# Patient Record
Sex: Female | Born: 1985 | Race: Black or African American | Hispanic: No | Marital: Single | Smoking: Never smoker
Health system: Southern US, Community
[De-identification: ages and names within clinical notes are randomized; demographics above are authoritative.]

## PROBLEM LIST (undated history)

## (undated) DIAGNOSIS — G43909 Migraine, unspecified, not intractable, without status migrainosus: Secondary | ICD-10-CM

## (undated) DIAGNOSIS — E669 Obesity, unspecified: Secondary | ICD-10-CM

## (undated) HISTORY — DX: Obesity, unspecified: E66.9

## (undated) HISTORY — DX: Migraine, unspecified, not intractable, without status migrainosus: G43.909

## (undated) HISTORY — PX: NO PAST SURGERIES: SHX2092

---

## 2007-05-12 ENCOUNTER — Emergency Department (HOSPITAL_COMMUNITY): Admission: EM | Admit: 2007-05-12 | Discharge: 2007-05-13 | Payer: Self-pay | Admitting: Emergency Medicine

## 2012-07-14 ENCOUNTER — Encounter: Payer: BC Managed Care – PPO | Admitting: *Deleted

## 2012-07-14 ENCOUNTER — Encounter: Payer: Self-pay | Admitting: *Deleted

## 2012-07-14 DIAGNOSIS — E669 Obesity, unspecified: Secondary | ICD-10-CM | POA: Insufficient documentation

## 2012-07-14 DIAGNOSIS — Z713 Dietary counseling and surveillance: Secondary | ICD-10-CM | POA: Insufficient documentation

## 2012-07-14 NOTE — Patient Instructions (Addendum)
Plan: Aim for 3 Carb choices (45 grams) per meal +/- 1 either way Aim for 0-1 carbs per snack if hungry Consider reading food labels for total carb of foods Consider increasing activity level as tolerated daily

## 2012-07-14 NOTE — Progress Notes (Signed)
  Medical Nutrition Therapy:  Appt start time: 1500 end time:  1600.  Assessment:  Primary concerns today: patient here for obesity and is at risk for diabetes. 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 She tries to go to gym 3 days a week but would like to increase that frequency. She lives with her Celine Ahr and 3 cousins. Her Aunt shops and prepares the meals.  MEDICATIONS: see list   DIETARY INTAKE:  Usual eating pattern includes 3 meals and 0 snacks per day.  Everyday foods include variety of all food groups.  Avoided foods include fried foods, sodas or milk, .    24-hr recall:  B ( AM): meal replacement shake or fresh fruit (a month ago) with water or juice. Used to do fast food in AM  Snk ( AM): none  L ( PM): brings left overs from home; meat, starch vegetables with water Snk ( PM): none D ( PM): usually at home; baked meat, starch, vegetables, occasionally salad with Ranch dressing, water or 8 oz juice Snk ( PM): none Beverages: juice, water  Usual physical activity: tries to go to gym 2-3 times a week, 30 min on treadmill and 15 minutes on elyptical OR does exercises at home  Estimated energy needs: 1400 calories 158 g carbohydrates 105 g protein 39 g fat  Progress Towards Goal(s):  In progress.   Nutritional Diagnosis:  NI-1.5 Excessive energy intake As related to activity level.  As evidenced by BMI of 38.6    Intervention:  Nutrition counseling initiated for weight loss. Discussed Carb Counting, reading food labels, and benefits of increased activity.  Plan: Aim for 3 Carb choices (45 grams) per meal +/- 1 either way Aim for 0-1 carbs per snack if hungry Consider reading food labels for total carb of foods Consider increasing activity level as tolerated daily  Handouts given during visit include: Carb Counting and Food Label handouts Meal Plan Card  Monitoring/Evaluation:  Dietary intake, exercise, reading food labels, and body weight  prn.

## 2013-06-22 ENCOUNTER — Encounter: Payer: Self-pay | Admitting: Certified Nurse Midwife

## 2014-03-31 ENCOUNTER — Encounter (HOSPITAL_COMMUNITY): Payer: Self-pay | Admitting: Emergency Medicine

## 2014-03-31 ENCOUNTER — Emergency Department (HOSPITAL_COMMUNITY)
Admission: EM | Admit: 2014-03-31 | Discharge: 2014-04-01 | Disposition: A | Discharge status: Home / Self Care | Payer: BC Managed Care – PPO | Attending: Emergency Medicine | Admitting: Emergency Medicine

## 2014-03-31 DIAGNOSIS — R112 Nausea with vomiting, unspecified: Secondary | ICD-10-CM | POA: Insufficient documentation

## 2014-03-31 DIAGNOSIS — E669 Obesity, unspecified: Secondary | ICD-10-CM | POA: Insufficient documentation

## 2014-03-31 NOTE — ED Notes (Signed)
Pt presents with c/o emesis onset 8am this morning with 1 episode of diarrhea.

## 2014-04-01 LAB — I-STAT CHEM 8, ED
BUN: 11 mg/dL (ref 6–23)
CHLORIDE: 106 meq/L (ref 96–112)
Calcium, Ion: 1.22 mmol/L (ref 1.12–1.23)
Creatinine, Ser: 0.8 mg/dL (ref 0.50–1.10)
Glucose, Bld: 81 mg/dL (ref 70–99)
HCT: 40 % (ref 36.0–46.0)
Hemoglobin: 13.6 g/dL (ref 12.0–15.0)
Potassium: 3.5 mEq/L — ABNORMAL LOW (ref 3.7–5.3)
SODIUM: 139 meq/L (ref 137–147)
TCO2: 22 mmol/L (ref 0–100)

## 2014-04-01 MED ORDER — SODIUM CHLORIDE 0.9 % IV SOLN
Freq: Once | INTRAVENOUS | Status: AC
  Administered 2014-04-01: 01:00:00 via INTRAVENOUS

## 2014-04-01 MED ORDER — ONDANSETRON 4 MG PO TBDP: 4.0000 mg | ORAL_TABLET | Freq: Three times a day (TID) | ORAL | Status: DC | PRN

## 2014-04-01 MED ORDER — ONDANSETRON HCL 4 MG/2ML IJ SOLN
4.0000 mg | Freq: Once | INTRAMUSCULAR | Status: AC
  Administered 2014-04-01: 4 mg via INTRAVENOUS
  Filled 2014-04-01: qty 2

## 2014-04-01 NOTE — ED Provider Notes (Signed)
Medical screening examination/treatment/procedure(s) were performed by non-physician practitioner and as supervising physician I was immediately available for consultation/collaboration.   EKG Interpretation None        Nikia Mangino, MD 04/01/14 2315 

## 2014-04-01 NOTE — Discharge Instructions (Signed)

## 2014-04-01 NOTE — ED Provider Notes (Signed)
CSN: [id number]     Arrival date & time 03/31/14  2339 History   First MD Initiated Contact with Patient 04/01/14 0008     Chief Complaint  Patient presents with  . Emesis     (Consider location/radiation/quality/duration/timing/severity/associated sxs/prior Treatment) HPI Comments: Nausea and vomiting throughout the day, unable to tolerate fluids had 1 bowel movement, denies any abdominal pain, UTI symptoms, dysuria  Patient is a 28 y.o. female presenting with vomiting. The history is provided by the patient.  Emesis Severity:  Mild Duration:  1 day Timing:  Constant Quality:  Bilious material Progression:  Unchanged Chronicity:  New Recent urination:  Normal Relieved by:  Nothing Associated symptoms: no abdominal pain, no chills and no diarrhea     Past Medical History  Diagnosis Date  . Obesity    History reviewed. No pertinent past surgical history. No family history on file. History  Substance Use Topics  . Smoking status: Never Smoker   . Smokeless tobacco: Never Used  . Alcohol Use: Yes     Comment: twice a month, mixed drinks or wine   OB History   Grav Para Term Preterm Abortions TAB SAB Ect Mult Living                 Review of Systems  Constitutional: Negative for fever and chills.  Gastrointestinal: Positive for nausea and vomiting. Negative for abdominal pain and diarrhea.  Genitourinary: Negative for dysuria.  All other systems reviewed and are negative.     Allergies  Review of patient's allergies indicates no known allergies.  Home Medications   Prior to Admission medications   Medication Sig Start Date End Date Taking? Authorizing Provider  ibuprofen (ADVIL,MOTRIN) 200 MG tablet Take 400 mg by mouth every 6 (six) hours as needed for headache.   Yes Historical Provider, MD  ondansetron (ZOFRAN ODT) 4 MG disintegrating tablet Take 1 tablet (4 mg total) by mouth every 8 (eight) hours as needed for nausea or vomiting. 04/01/14   Arman Filter,  NP   BP 108/58  Pulse 73  Temp(Src) 98.1 F (36.7 C) (Oral)  Resp 18  Ht 5\' 7"  (1.702 m)  Wt 195 lb (88.451 kg)  BMI 30.53 kg/m2  SpO2 100%  LMP 03/06/2014 Physical Exam  Nursing note and vitals reviewed. Constitutional: She is oriented to person, place, and time. She appears well-developed and well-nourished.  HENT:  Head: Normocephalic.  Eyes: Pupils are equal, round, and reactive to light.  Neck: Normal range of motion.  Cardiovascular: Normal rate and regular rhythm.   Pulmonary/Chest: Effort normal and breath sounds normal.  Musculoskeletal: Normal range of motion.  Neurological: She is alert and oriented to person, place, and time.  Skin: Skin is warm. No rash noted.    ED Course  Procedures (including critical care time) Labs Review Labs Reviewed  I-STAT CHEM 8, ED - Abnormal; Notable for the following:    Potassium 3.5 (*)    All other components within normal limits    Imaging Review No results found.   EKG Interpretation None      MDM  Patient not tolerating fluids.  Will be discharged home with prescription for Zofran Final diagnoses:  Non-intractable vomiting with nausea, vomiting of unspecified type         Arman Filter, NP 04/01/14 [phone]

## 2015-08-13 ENCOUNTER — Emergency Department (HOSPITAL_COMMUNITY)
Admission: EM | Admit: 2015-08-13 | Discharge: 2015-08-13 | Disposition: A | Discharge status: Home / Self Care | Payer: Managed Care, Other (non HMO) | Attending: Emergency Medicine | Admitting: Emergency Medicine

## 2015-08-13 ENCOUNTER — Encounter (HOSPITAL_COMMUNITY): Payer: Self-pay | Admitting: Emergency Medicine

## 2015-08-13 DIAGNOSIS — Z79899 Other long term (current) drug therapy: Secondary | ICD-10-CM | POA: Insufficient documentation

## 2015-08-13 DIAGNOSIS — R1084 Generalized abdominal pain: Secondary | ICD-10-CM | POA: Insufficient documentation

## 2015-08-13 DIAGNOSIS — Z3202 Encounter for pregnancy test, result negative: Secondary | ICD-10-CM | POA: Diagnosis not present

## 2015-08-13 DIAGNOSIS — R112 Nausea with vomiting, unspecified: Secondary | ICD-10-CM

## 2015-08-13 DIAGNOSIS — E669 Obesity, unspecified: Secondary | ICD-10-CM | POA: Diagnosis not present

## 2015-08-13 LAB — CBC WITH DIFFERENTIAL/PLATELET
BASOS PCT: 0 %
Basophils Absolute: 0 10*3/uL (ref 0.0–0.1)
EOS PCT: 0 %
Eosinophils Absolute: 0 10*3/uL (ref 0.0–0.7)
HEMATOCRIT: 36.1 % (ref 36.0–46.0)
HEMOGLOBIN: 12.3 g/dL (ref 12.0–15.0)
LYMPHS PCT: 5 %
Lymphs Abs: 0.3 10*3/uL — ABNORMAL LOW (ref 0.7–4.0)
MCH: 23 pg — AB (ref 26.0–34.0)
MCHC: 34.1 g/dL (ref 30.0–36.0)
MCV: 67.5 fL — AB (ref 78.0–100.0)
Monocytes Absolute: 0.3 10*3/uL (ref 0.1–1.0)
Monocytes Relative: 5 %
NEUTROS ABS: 6.1 10*3/uL (ref 1.7–7.7)
NEUTROS PCT: 90 %
Platelets: 243 10*3/uL (ref 150–400)
RBC: 5.35 MIL/uL — ABNORMAL HIGH (ref 3.87–5.11)
RDW: 14.7 % (ref 11.5–15.5)
WBC: 6.7 10*3/uL (ref 4.0–10.5)

## 2015-08-13 LAB — COMPREHENSIVE METABOLIC PANEL
ALT: 16 U/L (ref 14–54)
ANION GAP: 9 (ref 5–15)
AST: 20 U/L (ref 15–41)
Albumin: 3.9 g/dL (ref 3.5–5.0)
Alkaline Phosphatase: 40 U/L (ref 38–126)
BUN: 10 mg/dL (ref 6–20)
CHLORIDE: 106 mmol/L (ref 101–111)
CO2: 22 mmol/L (ref 22–32)
Calcium: 9.2 mg/dL (ref 8.9–10.3)
Creatinine, Ser: 0.86 mg/dL (ref 0.44–1.00)
Glucose, Bld: 112 mg/dL — ABNORMAL HIGH (ref 65–99)
POTASSIUM: 3.8 mmol/L (ref 3.5–5.1)
Sodium: 137 mmol/L (ref 135–145)
Total Bilirubin: 1.4 mg/dL — ABNORMAL HIGH (ref 0.3–1.2)
Total Protein: 7.6 g/dL (ref 6.5–8.1)

## 2015-08-13 LAB — POC URINE PREG, ED: Preg Test, Ur: NEGATIVE

## 2015-08-13 LAB — I-STAT BETA HCG BLOOD, ED (MC, WL, AP ONLY)

## 2015-08-13 LAB — URINE MICROSCOPIC-ADD ON: RBC / HPF: NONE SEEN RBC/hpf (ref 0–5)

## 2015-08-13 LAB — URINALYSIS, ROUTINE W REFLEX MICROSCOPIC
GLUCOSE, UA: NEGATIVE mg/dL
HGB URINE DIPSTICK: NEGATIVE
Ketones, ur: 15 mg/dL — AB
Nitrite: NEGATIVE
PH: 6 (ref 5.0–8.0)
Protein, ur: NEGATIVE mg/dL
SPECIFIC GRAVITY, URINE: 1.027 (ref 1.005–1.030)

## 2015-08-13 LAB — LIPASE, BLOOD: LIPASE: 32 U/L (ref 11–51)

## 2015-08-13 MED ORDER — ONDANSETRON HCL 4 MG/2ML IJ SOLN
4.0000 mg | Freq: Once | INTRAMUSCULAR | Status: AC
  Administered 2015-08-13: 4 mg via INTRAVENOUS
  Filled 2015-08-13: qty 2

## 2015-08-13 MED ORDER — SODIUM CHLORIDE 0.9 % IV BOLUS (SEPSIS)
1000.0000 mL | Freq: Once | INTRAVENOUS | Status: AC
  Administered 2015-08-13: 1000 mL via INTRAVENOUS

## 2015-08-13 MED ORDER — ONDANSETRON HCL 4 MG PO TABS: 4.0000 mg | ORAL_TABLET | Freq: Four times a day (QID) | ORAL | Status: DC

## 2015-08-13 NOTE — ED Notes (Signed)
Patient d/c'd self care.  F/U and medications discussed.  Patient verbalized understanding. 

## 2015-08-13 NOTE — ED Notes (Signed)
Patient requested to urinate. Patient states she can not urinate at this time.

## 2015-08-13 NOTE — ED Notes (Signed)
Patient ambulatory to restroom  ?

## 2015-08-13 NOTE — Discharge Instructions (Signed)
There does not appear to be an emergent cause for her symptoms at this time. Your exam and labs were all reassuring. Please follow-up with your doctor as needed for reevaluation. Return to ED for immediate reevaluation if you have worsening abdominal pain, fevers, chills or persistent nausea and vomiting as these can all be signs of early appendicitis.  Abdominal Pain, Adult Many things can cause abdominal pain. Usually, abdominal pain is not caused by a disease and will improve without treatment. It can often be observed and treated at home. Your health care provider will do a physical exam and possibly order blood tests and X-rays to help determine the seriousness of your pain. However, in many cases, more time must pass before a clear cause of the pain can be found. Before that point, your health care provider may not know if you need more testing or further treatment. HOME CARE INSTRUCTIONS Monitor your abdominal pain for any changes. The following actions may help to alleviate any discomfort you are experiencing:  Only take over-the-counter or prescription medicines as directed by your health care provider.  Do not take laxatives unless directed to do so by your health care provider.  Try a clear liquid diet (broth, tea, or water) as directed by your health care provider. Slowly move to a bland diet as tolerated. SEEK MEDICAL CARE IF:  You have unexplained abdominal pain.  You have abdominal pain associated with nausea or diarrhea.  You have pain when you urinate or have a bowel movement.  You experience abdominal pain that wakes you in the night.  You have abdominal pain that is worsened or improved by eating food.  You have abdominal pain that is worsened with eating fatty foods.  You have a fever. SEEK IMMEDIATE MEDICAL CARE IF:  Your pain does not go away within 2 hours.  You keep throwing up (vomiting).  Your pain is felt only in portions of the abdomen, such as the right  side or the left lower portion of the abdomen.  You pass bloody or black tarry stools. MAKE SURE YOU:  Understand these instructions.  Will watch your condition.  Will get help right away if you are not doing well or get worse.   This information is not intended to replace advice given to you by your health care provider. Make sure you discuss any questions you have with your health care provider.   Document Released: 06/03/2005 Document Revised: 05/15/2015 Document Reviewed: 05/03/2013 Elsevier Interactive Patient Education [employer]

## 2015-08-13 NOTE — ED Notes (Signed)
Pt c/o abd pain n/v/d onset last evening @ 2100. Emesis once an hour per pt.

## 2015-08-13 NOTE — ED Provider Notes (Signed)
CSN: [id number]     Arrival date & time 08/13/15  0548 History   First MD Initiated Contact with Patient 08/13/15 [phone]     Chief Complaint  Patient presents with  . Emesis     (Consider location/radiation/quality/duration/timing/severity/associated sxs/prior Treatment) HPI Michele Krause is a 29 y.o. female who comes in for evaluation of nausea and vomiting with abdominal discomfort. Patient reports yesterday morning she started having mild, diffuse abdominal pain with one episode of loose stool. She reports when she ate dinner last night at approximately 8 PM, she started having worsening abdominal discomfort and nausea with vomiting "every hour until I got to the emergency department". Eating makes the problem worse. Nothing seems to make it better. No interventions tried. Denies fevers, chills, urinary symptoms, constipation or diarrhea, unusual vaginal bleeding or discharge, back pain. Last menstrual period November 10 and normal for her. No other modifying factors  Past Medical History  Diagnosis Date  . Obesity    History reviewed. No pertinent past surgical history. No family history on file. Social History  Substance Use Topics  . Smoking status: Never Smoker   . Smokeless tobacco: Never Used  . Alcohol Use: Yes     Comment: twice a month, mixed drinks or wine   OB History    No data available     Review of Systems A 10 point review of systems was completed and was negative except for pertinent positives and negatives as mentioned in the history of present illness     Allergies  Review of patient's allergies indicates no known allergies.  Home Medications   Prior to Admission medications   Medication Sig Start Date End Date Taking? Authorizing Provider  ibuprofen (ADVIL,MOTRIN) 200 MG tablet Take 400 mg by mouth every 6 (six) hours as needed for headache.   Yes Historical Provider, MD  Multiple Vitamin (MULTIVITAMIN WITH MINERALS) TABS tablet Take 1 tablet by mouth  daily. Herbal Life Multivitamin   Yes Historical Provider, MD  NON FORMULARY Take 1 capsule by mouth daily. Her[facility] Cell-U-Loss   Yes Historical Provider, MD  NON FORMULARY Take 1 capsule by mouth daily. Herbal Life Total Control   Yes Historical Provider, MD  OVER THE COUNTER MEDICATION Take 1 capsule by mouth daily. Her[facility] Cell Activator   Yes Historical Provider, MD  phentermine (ADIPEX-P) 37.5 MG tablet Take 37.5 mg by mouth every morning. 06/20/15  Yes Historical Provider, MD  ondansetron (ZOFRAN) 4 MG tablet Take 1 tablet (4 mg total) by mouth every 6 (six) hours. 08/13/15   Joycie Peek, PA-C   BP 114/72 mmHg  Pulse 82  Temp(Src) 99.2 F (37.3 C) (Oral)  Resp 16  Ht  (1.727 m)  Wt 90.719 kg  BMI 30.42 kg/m2  SpO2 100% Physical Exam  Constitutional: She is oriented to person, place, and time. She appears well-developed and well-nourished.  HENT:  Head: Normocephalic and atraumatic.  Mouth/Throat: Oropharynx is clear and moist.  Eyes: Conjunctivae are normal. Pupils are equal, round, and reactive to light. Right eye exhibits no discharge. Left eye exhibits no discharge. No scleral icterus.  Neck: Neck supple.  Cardiovascular: Normal rate, regular rhythm and normal heart sounds.   Pulmonary/Chest: Effort normal and breath sounds normal. No respiratory distress. She has no wheezes. She has no rales.  Abdominal: Soft. There is no tenderness.  Diffuse abdominal discomfort with palpation. Abdomen otherwise soft, nondistended. No rebound or guarding.  Musculoskeletal: She exhibits no tenderness.  Neurological: She is alert  and oriented to person, place, and time.  Cranial Nerves II-XII grossly intact  Skin: Skin is warm and dry. No rash noted.  Psychiatric: She has a normal mood and affect.  Nursing note and vitals reviewed.   ED Course  Procedures (including critical care time) Labs Review Labs Reviewed  COMPREHENSIVE METABOLIC PANEL - Abnormal; Notable for the  following:    Glucose, Bld 112 (*)    Total Bilirubin 1.4 (*)    All other components within normal limits  CBC WITH DIFFERENTIAL/PLATELET - Abnormal; Notable for the following:    RBC 5.35 (*)    MCV 67.5 (*)    MCH 23.0 (*)    Lymphs Abs 0.3 (*)    All other components within normal limits  URINALYSIS, ROUTINE W REFLEX MICROSCOPIC (NOT AT [facility]) - Abnormal; Notable for the following:    Color, Urine AMBER (*)    APPearance CLOUDY (*)    Bilirubin Urine SMALL (*)    Ketones, ur 15 (*)    Leukocytes, UA TRACE (*)    All other components within normal limits  URINE MICROSCOPIC-ADD ON - Abnormal; Notable for the following:    Squamous Epithelial / LPF 0-5 (*)    Bacteria, UA RARE (*)    All other components within normal limits  LIPASE, BLOOD  I-STAT BETA HCG BLOOD, ED ([facility], [facility], [facility] ONLY)  POC URINE PREG, ED    Imaging Review No results found. I have personally reviewed and evaluated these images and lab results as part of my medical decision-making.   EKG Interpretation None     Meds given in ED:  Medications  ondansetron (ZOFRAN) injection 4 mg (4 mg Intravenous Given 08/13/15 0656)  sodium chloride 0.9 % bolus 1,000 mL (0 mLs Intravenous Stopped 08/13/15 0813)    Discharge Medication List as of 08/13/2015  8:44 AM    START taking these medications   Details  ondansetron (ZOFRAN) 4 MG tablet Take 1 tablet (4 mg total) by mouth every 6 (six) hours., Starting 08/13/2015, Until Discontinued, Print       Filed Vitals:   08/13/15 0557 08/13/15 0730 08/13/15 0853  BP: 113/78 128/76 114/72  Pulse: 117 84 82  Temp: 98 F (36.7 C) 98.1 F (36.7 C) 99.2 F (37.3 C)  TempSrc: Oral Oral Oral  Resp: Height:  (1.727 m)    Weight: 90.719 kg    SpO2: 100% 100% 100%    MDM  Michele Krause is a 29 y.o. female who presents for evaluation of abdominal discomfort with nausea and vomiting and loose stool. On arrival, she appears well, afebrile and there is a  recorded tachycardia, however there is no tachycardia on my exam and discharge heart rate is 84. Possible mild dehydration versus activity trying to get back to room. No nausea or vomiting in the ED, no loose stool or diarrhea. She is tolerating oral fluids.  Feels much better after administration of Zofran and IV fluids. No focal abdominal tenderness on exam. Labs are unremarkable. No evidence of overt UTI. Discussed discharge with some symptomatic support at home, including anti-emetics, aggressive oral rehydration at home. Follow-up with PCP as needed. Overall, patient appears well, nontoxic, hemodynamically stable and afebrile and is appropriate for outpatient follow-up. Final diagnoses:  Non-intractable vomiting with nausea, vomiting of unspecified type  Abdominal discomfort, generalized       Joycie Peek, PA-C 08/14/15 1610  Shon Baton, MD 08/14/15 [phone]

## 2020-02-09 ENCOUNTER — Other Ambulatory Visit: Payer: Self-pay | Admitting: Nurse Practitioner

## 2020-02-09 DIAGNOSIS — G4452 New daily persistent headache (NDPH): Secondary | ICD-10-CM

## 2020-03-09 ENCOUNTER — Ambulatory Visit
Admission: RE | Admit: 2020-03-09 | Discharge: 2020-03-09 | Disposition: A | Discharge status: Home / Self Care | Payer: Managed Care, Other (non HMO) | Source: Ambulatory Visit | Attending: Nurse Practitioner | Admitting: Nurse Practitioner

## 2020-03-09 DIAGNOSIS — G4452 New daily persistent headache (NDPH): Secondary | ICD-10-CM

## 2020-03-27 ENCOUNTER — Ambulatory Visit (INDEPENDENT_AMBULATORY_CARE_PROVIDER_SITE_OTHER): Payer: 59 | Admitting: Neurology

## 2020-03-27 ENCOUNTER — Encounter: Payer: Self-pay | Admitting: Neurology

## 2020-03-27 VITALS — BP 114/73 | HR 91 | Ht 67.0 in | Wt 185.0 lb

## 2020-03-27 DIAGNOSIS — R200 Anesthesia of skin: Secondary | ICD-10-CM

## 2020-03-27 DIAGNOSIS — IMO0002 Reserved for concepts with insufficient information to code with codable children: Secondary | ICD-10-CM

## 2020-03-27 DIAGNOSIS — H532 Diplopia: Secondary | ICD-10-CM

## 2020-03-27 DIAGNOSIS — R9089 Other abnormal findings on diagnostic imaging of central nervous system: Secondary | ICD-10-CM | POA: Diagnosis not present

## 2020-03-27 DIAGNOSIS — G43709 Chronic migraine without aura, not intractable, without status migrainosus: Secondary | ICD-10-CM

## 2020-03-27 MED ORDER — AJOVY 225 MG/1.5ML ~~LOC~~ SOSY
1.0000 | PREFILLED_SYRINGE | SUBCUTANEOUS | 3 refills | Status: DC
Start: 2020-03-27 — End: 2023-10-05

## 2020-03-27 NOTE — Progress Notes (Signed)
[facility]  PATIENT: Michele Krause DOB: 12-Sep-1985  REFERRING DOCTOR OR PCP: Mitzi Hansen, MD SOURCE: Patient, notes from primary care, imaging laboratory reports, MRI images personally reviewed.  _________________________________   HISTORICAL  CHIEF COMPLAINT:  Chief Complaint  Patient presents with  . New Patient (Initial Visit)    RM 13. Paper referral from Mitzi Hansen, NP for abnormal MRI/MS?     HISTORY OF PRESENT ILLNESS:  I had the pleasure of seeing patient, Michele Krause, at the MS center at [facility] for neurologic consultation regarding her headaches and abnormal brain MRI potentially worrisome for multiple sclerosis.  She is a 34 year old woman who has had difficulty with frequent migraine headaches that became daily around March 2021.   After a trip to [place] in April she had a pounding pain with reduced vision OD for 30 minutes.   She would get intermittent visual changes like this associated with headaches, always on the right.  Vision seemed ok in between headaches.     She has noted diplopia, worse with looking at a computer screen for many years.  However, this is much worse this year, even when she does not have a headache.    She notes it more when focusing on something.      She had the onset of left sided numbness 3 weeks ago from shoulder to leg that she notes if she sits a while or stands a while.   She also notes her balance has been poor since she was younger.   For the past 2 years, though, she notes a feeling like she will topple over if she stands on one spot.     She started to see the Headache center and was placed on zonisamide and trigger point injections.   However, this has not helped much and they occur daily.   She gets nausea, photophobia and phonophobia.   She takes sumatriptan or baclofen when a headache occurs.   This only helps the stabbing pain but does not make the headache go away.    For  chronic migraines she has tried and failed anti-epileptic (zonisamide), muscle relaxant (baclofen) and triptans (sumatriptrans)  I personally reviewed the MRI of the brain dated 03/09/2020.  It shows multiple T2/FLAIR hyperintense foci.  2 are located in the right middle cerebellar peduncle and adjacent cerebellum others are located in the hemispheres, some in the periventricular white matter and some in the deep white matter.  Sagittal images were not performed.  Contrast was not performed but two foci in the periventricular white matter were mildly bright on diffusion-weighted images.  She has a second cousin with MS diagnosed in 2002 diagnosed in early 76's.      REVIEW OF SYSTEMS: Constitutional: No fevers, chills, sweats, or change in appetite Eyes: No visual changes, double vision, eye pain Ear, nose and throat: No hearing loss, ear pain, nasal congestion, sore throat Cardiovascular: No chest pain, palpitations Respiratory: No shortness of breath at rest or with exertion.   No wheezes GastrointestinaI: No nausea, vomiting, diarrhea, abdominal pain, fecal incontinence Genitourinary: No dysuria, urinary retention or frequency.  No nocturia. Musculoskeletal: No neck pain, back pain Integumentary: No rash, pruritus, skin lesions Neurological: as above Psychiatric: No depression at this time.  No anxiety Endocrine: No palpitations, diaphoresis, change in appetite, change in weigh or increased thirst Hematologic/Lymphatic: No anemia, purpura, petechiae. Allergic/Immunologic: No itchy/runny eyes, nasal congestion, recent allergic reactions, rashes  ALLERGIES: No Known Allergies  HOME MEDICATIONS:  Current Outpatient Medications:  .  baclofen (LIORESAL) 10 MG tablet, Take 1 tablet by mouth every 30 (thirty) days., Disp: , Rfl:  .  ibuprofen (ADVIL,MOTRIN) 200 MG tablet, Take 400 mg by mouth every 6 (six) hours as needed for headache., Disp: , Rfl:  .  Multiple Vitamin (MULTIVITAMIN  WITH MINERALS) TABS tablet, Take 1 tablet by mouth daily. Herbal Life Multivitamin, Disp: , Rfl:  .  NON FORMULARY, Take 1 capsule by mouth daily. Herbal Life Cell-U-Loss, Disp: , Rfl:  .  NON FORMULARY, Take 1 capsule by mouth daily. Herbal Life Total Control, Disp: , Rfl:  .  Norethindrone-Ethinyl Estradiol-Fe (KAITLIB FE) 0.8-25 MG-MCG tablet, Chew 1 tablet by mouth daily., Disp: , Rfl:  .  ondansetron (ZOFRAN) 4 MG tablet, Take 1 tablet (4 mg total) by mouth every 6 (six) hours., Disp: 12 tablet, Rfl: 0 .  OVER THE COUNTER MEDICATION, Take 1 capsule by mouth daily. Herbal Life Cell Activator, Disp: , Rfl:  .  SUMAtriptan (IMITREX) 100 MG tablet, Take 100 mg by mouth as needed., Disp: , Rfl:  .  zonisamide (ZONEGRAN) 100 MG capsule, Take 100 mg by mouth daily. Will be going to 150mg  po qd, Disp: , Rfl:  .  phentermine (ADIPEX-P) 37.5 MG tablet, Take 37.5 mg by mouth every morning. (Patient not taking: Reported on 03/27/2020), Disp: , Rfl: 1  PAST MEDICAL HISTORY: Past Medical History:  Diagnosis Date  . Migraine   . Obesity     PAST SURGICAL HISTORY: Past Surgical History:  Procedure Laterality Date  . NO PAST SURGERIES      FAMILY HISTORY: History reviewed. No pertinent family history.  SOCIAL HISTORY:  Social History   Socioeconomic History  . Marital status: Single    Spouse name: Not on file  . Number of children: 0  . Years of education: BA  . Highest education level: Not on file  Occupational History  . Occupation: on [employer]  Tobacco Use  . Smoking status: Never Smoker  . Smokeless tobacco: Never Used  Substance and Sexual Activity  . Alcohol use: Yes    Comment: socially- 2 glasses per week or less  . Drug use: No  . Sexual activity: Not on file  Other Topics Concern  . Not on file  Social History Narrative   Right handed    Caffeine use: none   Social Determinants of Health   Financial Resource Strain:   . Difficulty of Paying Living Expenses:   Food  Insecurity:   . Worried About Programme researcher, broadcasting/film/video in the Last Year:   . Barista in the Last Year:   Transportation Needs:   . Freight forwarder (Medical):   [name] Kitchen Lack of Transportation (Non-Medical):   Physical Activity:   . Days of Exercise per Week:   . Minutes of Exercise per Session:   Stress:   . Feeling of Stress :   Social Connections:   . Frequency of Communication with Friends and Family:   . Frequency of Social Gatherings with Friends and Family:   . Attends Religious Services:   . Active Member of Clubs or Organizations:   . Attends Banker Meetings:   [name] Kitchen Marital Status:   Intimate Partner Violence:   . Fear of Current or Ex-Partner:   . Emotionally Abused:   [name] Kitchen Physically Abused:   . Sexually Abused:      PHYSICAL EXAM  There were no vitals filed for this visit.  There is no height or weight on file to calculate BMI.   Hearing Screening   125Hz  250Hz  500Hz  1000Hz  2000Hz  3000Hz  4000Hz  6000Hz  8000Hz   Right ear:           Left ear:             Visual Acuity Screening   Right eye Left eye Both eyes  Without correction: 20/30 20/30 20/30   With correction:       General: The patient is well-developed and well-nourished and in no acute distress  HEENT:  Head is [place]/AT.  Sclera are anicteric.  Funduscopic exam shows normal optic discs and retinal vessels.  Neck: No carotid bruits are noted.  The neck is nontender.  Cardiovascular: The heart has a regular rate and rhythm with a normal S1 and S2. There were no murmurs, gallops or rubs.    Skin: Extremities are without rash or  edema.  Musculoskeletal:  Back is nontender  Neurologic Exam  Mental status: The patient is alert and oriented x 3 at the time of the examination. The patient has apparent normal recent and remote memory, with an apparently normal attention span and concentration ability.   Speech is normal.  Cranial nerves: Extraocular movements are full. Pupils are equal, round,  and reactive to light and accomodation.  Visual fields are full.  Facial symmetry is present. There is good facial sensation to soft touch bilaterally.Facial strength is normal.  Trapezius and sternocleidomastoid strength is normal. No dysarthria is noted.  The tongue is midline, and the patient has symmetric elevation of the soft palate. No obvious hearing deficits are noted.  Motor:  Muscle bulk is normal.   Tone is normal. Strength is  5 / 5 in all 4 extremities.   Sensory: Sensory testing is intact to pinprick, soft touch and vibration sensation in all 4 extremities.  Coordination: Cerebellar testing reveals good finger-nose-finger and heel-to-shin bilaterally.  Gait and station: Station is normal.   Gait is normal. Tandem gait is mildly wide. Romberg is negative.   Reflexes: Deep tendon reflexes are symmetric and normal bilaterally.   Plantar responses are flexor.     ASSESSMENT AND PLAN  Abnormal brain MRI - Plan: MR BRAIN W CONTRAST, MR CERVICAL SPINE W WO CONTRAST  Diplopia  Numbness - Plan: MR BRAIN W CONTRAST, MR CERVICAL SPINE W WO CONTRAST  Chronic migraine   In summary, Michele Krause is a 34 year old woman with chronic migraine who was found on MRI to have multiple T2/FLAIR hyperintense foci in a pattern potentially consistent with multiple sclerosis.  Additionally, she has had some mild intermittent neurologic symptoms such as numbness and diplopia but currently has no definite objective findings on examination.  I will check an MRI of the cervical spine due to the numbness and coordination issues that she has reported to determine if she has any demyelinating plaque in the spinal cord.  Additionally will order an MRI of the brain with contrast (prior 1 was without).  If she has any lesions in the cervical spine, additional lesions in the brain or enhancing lesions of the brain, 10 she would meet the criteria for multiple sclerosis and I would recommend that we initiate a disease  modifying therapy.  If the additional scans did not show any abnormalities, then we would need to have additional data to diagnose MS.  I discussed with her that the 2 options would be to check a lumbar puncture or to check an MRI of the brain in 6  to 9 months.  We will make a decision after the MRI studies are complete.  A second problem is the chronic migraine.  She feels that there has not been any benefit from the series of trigger point injections and not much benefit from zonisamide.  I gave her a sample of Ajovy and sent in a prescription.  Hopefully this will help the headaches more.  She will return to see me for regular visit in several months but we will bring her in sooner if the studies are consistent with multiple sclerosis to initiate a disease modifying therapy.  Thank you for asking me to see Michele Krause.  Please let me know if I can be of further assistance with her or other patients in the future.    For chronic migraines she has tried and failed anti-epileptic (zonisamide), muscle relaxant (baclofen) and triptans (sumatriptrans)   Avian Konigsberg A. Epimenio Foot, MD, [facility] 03/27/2020, 1:39 PM Certified in Neurology, Clinical Neurophysiology, Sleep Medicine and Neuroimaging  [facility] [address] [place], [place] [address] [phone]

## 2020-03-28 ENCOUNTER — Telehealth: Payer: Self-pay | Admitting: *Deleted

## 2020-03-28 NOTE — Telephone Encounter (Signed)
Submitted PA Ajovy on CMM. Key: [id number]. Waiting on determination from [facility].

## 2020-04-01 NOTE — Telephone Encounter (Signed)
Received fax from [facility] that PA approved 03/30/20-06/30/20. PA# [employer] [id number]

## 2020-04-21 ENCOUNTER — Ambulatory Visit
Admission: RE | Admit: 2020-04-21 | Discharge: 2020-04-21 | Disposition: A | Discharge status: Home / Self Care | Payer: 59 | Source: Ambulatory Visit | Attending: Neurology | Admitting: Neurology

## 2020-04-21 ENCOUNTER — Other Ambulatory Visit: Payer: Self-pay

## 2020-04-21 DIAGNOSIS — R9089 Other abnormal findings on diagnostic imaging of central nervous system: Secondary | ICD-10-CM

## 2020-04-21 DIAGNOSIS — R200 Anesthesia of skin: Secondary | ICD-10-CM

## 2020-04-21 MED ORDER — GADOBENATE DIMEGLUMINE 529 MG/ML IV SOLN
15.0000 mL | Freq: Once | INTRAVENOUS | Status: AC | PRN
  Administered 2020-04-21: 15 mL via INTRAVENOUS

## 2020-04-29 ENCOUNTER — Ambulatory Visit: Payer: Self-pay | Admitting: Neurology

## 2020-05-01 ENCOUNTER — Encounter: Payer: Self-pay | Admitting: Neurology

## 2020-05-01 ENCOUNTER — Ambulatory Visit (INDEPENDENT_AMBULATORY_CARE_PROVIDER_SITE_OTHER): Payer: 59 | Admitting: Neurology

## 2020-05-01 ENCOUNTER — Telehealth: Payer: Self-pay | Admitting: *Deleted

## 2020-05-01 VITALS — BP 117/85 | HR 75 | Ht 67.0 in | Wt 186.0 lb

## 2020-05-01 DIAGNOSIS — H539 Unspecified visual disturbance: Secondary | ICD-10-CM

## 2020-05-01 DIAGNOSIS — Z5181 Encounter for therapeutic drug level monitoring: Secondary | ICD-10-CM | POA: Diagnosis not present

## 2020-05-01 DIAGNOSIS — E559 Vitamin D deficiency, unspecified: Secondary | ICD-10-CM

## 2020-05-01 DIAGNOSIS — H532 Diplopia: Secondary | ICD-10-CM | POA: Insufficient documentation

## 2020-05-01 DIAGNOSIS — G35 Multiple sclerosis: Secondary | ICD-10-CM | POA: Diagnosis not present

## 2020-05-01 DIAGNOSIS — R200 Anesthesia of skin: Secondary | ICD-10-CM

## 2020-05-01 DIAGNOSIS — Z79899 Other long term (current) drug therapy: Secondary | ICD-10-CM | POA: Insufficient documentation

## 2020-05-01 NOTE — Telephone Encounter (Signed)
Placed [facility] lab in [facility] lock box for routine lab pick up. Results pending. 

## 2020-05-01 NOTE — Progress Notes (Signed)
[facility]  PATIENT: Michele Krause DOB: 03/12/1986  REFERRING DOCTOR OR PCP: Mitzi Hansen, MD SOURCE: Patient, notes from primary care, imaging laboratory reports, MRI images personally reviewed.  _________________________________   HISTORICAL  CHIEF COMPLAINT:  Chief Complaint  Patient presents with  . Follow-up    RM 13, alone.  Last seen 03/27/20. She has noticed swelling in feet/numb in calves that started 2-3 weeks ago. Happening more frequently and lasting longer.   . Migraine    Has not started on Ajovy yet. Stopped steroid inj at headache center. States migraines have been okay. She wanted to figure out MS first prior to starting on this medication.    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  On further questioning, she has had some neurologic symptoms.    She had the onset of left sided numbness in early June 2021 from shoulder to leg that she notes if she sits a while or stands a while.  In April 2021, she noted headache and reduced left sided vision.     Since 2019,  she notes a feeling like she will topple over if she stands on one spot.     Since the last visit, she had MRI of the brain and cervical spine.  The MRI of the cervical spine MRI 04/21/2020.   It showed 3 small T2 hyperintense foci within the spinal cord, adjacent to C3, C4-C5 and C6-C7 as detailed above.  None of these enhance or appear to be acute.  These are consistent with chronic demyelinating plaque associated with multiple sclerosis.   MRI brain 04/21/2020 showed multiple T2/FLAIR hyperintensity involving right cerebellum, subtle brainstem, periventricular and pericallosal regions with suggestive of demyelinating disease.  No enhancing lesions are noted.  It was unchanged compared to the 03/09/2020 MRI  She is on birth control currently.    She is now planning for  a pregnancy in the next couple years but will likely want to have one more child.  In the future.    She has Ajovy shots now but since migraines got better she has not started.   HA improved going up to 150 mg zonisamide.       She just got the Covid vaccination x 2 June/July 2021.    I discussed with her that given her history of neurologic symptoms consistent with mild relapses and the abnormal MRI of the brain and cervical spine, she meets the McDonald criteria for relapsing remitting MS.  We had a long conversation about various treatment options for MS    MS History Donald Pacini had difficulty with frequent migraine headaches that became daily around March 2021.  She had an MRI 03/09/2020 showing changes c/w MS (Radiologically isolated syndrome) and was referred to me.   On further review, she has had some neurologic symptoms.    She had the onset of left sided numbness in early June 2021 from shoulder to leg that she notes if she sits a while or stands a while.  In April 2021, she noted headache and reduced left sided vision.   She also notes her balance has been poor since she was younger.   Since 2019, though, she notes a feeling like she will topple over if she stands on one spot.     I personally reviewed the MRI of the brain dated 03/09/2020.  It shows multiple T2/FLAIR  hyperintense foci.  2 are located in the right middle cerebellar peduncle and adjacent cerebellum others are located in the hemispheres, some in the periventricular white matter and some in the deep white matter.  Sagittal images were not performed.  Contrast was not performed but two foci in the periventricular white matter were mildly bright on diffusion-weighted images.  She has a second cousin with MS diagnosed in 2002 diagnosed in early 7's.     The MRI of the cervical spine MRI 04/21/2020.   It showed 3 small T2 hyperintense foci within the spinal cord, adjacent to C3, C4-C5 and C6-C7 as detailed above.  None of these  enhance or appear to be acute.  These are consistent with chronic demyelinating plaque associated with multiple sclerosis.     MRI brain 04/21/2020 showed multiple T2/FLAIR hyperintensity involving right cerebellum, subtle brainstem, periventricular and pericallosal regions with suggestive of demyelinating disease.  No enhancing lesions are noted.  It was unchanged compared to the 03/09/2020 MRI   Headache history: She is currently on zonisamide and has had trigger point injections for her headaches.  The higher dose of zonisamide has helped the headache more.  She gets nausea, photophobia and phonophobia.   She takes sumatriptan or baclofen when a headache occurs.   This only helps the stabbing pain but does not make the headache go away.    For chronic migraines she has tried and failed anti-epileptic (zonisamide), muscle relaxant (baclofen) and triptans (sumatriptrans).  I gave her a prescription for Ajovy that she has filled.  She has not yet started.   REVIEW OF SYSTEMS: Constitutional: No fevers, chills, sweats, or change in appetite Eyes: No visual changes, double vision, eye pain Ear, nose and throat: No hearing loss, ear pain, nasal congestion, sore throat Cardiovascular: No chest pain, palpitations Respiratory: No shortness of breath at rest or with exertion.   No wheezes GastrointestinaI: No nausea, vomiting, diarrhea, abdominal pain, fecal incontinence Genitourinary: No dysuria, urinary retention or frequency.  No nocturia. Musculoskeletal: No neck pain, back pain Integumentary: No rash, pruritus, skin lesions Neurological: as above Psychiatric: No depression at this time.  No anxiety Endocrine: No palpitations, diaphoresis, change in appetite, change in weigh or increased thirst Hematologic/Lymphatic: No anemia, purpura, petechiae. Allergic/Immunologic: No itchy/runny eyes, nasal congestion, recent allergic reactions, rashes  ALLERGIES: No Known Allergies  HOME  MEDICATIONS:  Current Outpatient Medications:  .  baclofen (LIORESAL) 10 MG tablet, Take 1 tablet by mouth every 30 (thirty) days., Disp: , Rfl:  .  ibuprofen (ADVIL,MOTRIN) 200 MG tablet, Take 400 mg by mouth every 6 (six) hours as needed for headache., Disp: , Rfl:  .  KAITLIB FE 0.8-25 MG-MCG tablet, 1 tablet daily., Disp: , Rfl:  .  LINZESS 145 MCG CAPS capsule, Take 145 mcg by mouth daily., Disp: , Rfl:  .  Multiple Vitamin (MULTIVITAMIN WITH MINERALS) TABS tablet, Take 1 tablet by mouth daily. Herbal Life Multivitamin, Disp: , Rfl:  .  NON FORMULARY, Take 1 capsule by mouth daily. Herbal Life Cell-U-Loss, Disp: , Rfl:  .  NON FORMULARY, Take 1 capsule by mouth daily. Herbal Life Total Control, Disp: , Rfl:  .  Norethindrone-Ethinyl Estradiol-Fe (KAITLIB FE) 0.8-25 MG-MCG tablet, Chew 1 tablet by mouth daily., Disp: , Rfl:  .  ondansetron (ZOFRAN) 4 MG tablet, Take 1 tablet (4 mg total) by mouth every 6 (six) hours., Disp: 12 tablet, Rfl: 0 .  OVER THE COUNTER MEDICATION, Take 1 capsule by mouth daily. Herbal Life Cell  Activator, Disp: , Rfl:  .  phentermine (ADIPEX-P) 37.5 MG tablet, Take 37.5 mg by mouth every morning. , Disp: , Rfl: 1 .  SUMAtriptan (IMITREX) 100 MG tablet, Take 100 mg by mouth as needed., Disp: , Rfl:  .  zonisamide (ZONEGRAN) 100 MG capsule, Take 100 mg by mouth daily. Will be going to 150mg  po qd, Disp: , Rfl:  .  Fremanezumab-vfrm (AJOVY) 225 MG/1.5ML SOSY, Inject 1 Syringe into the skin every 28 (twenty-eight) days. (Patient not taking: Reported on 05/01/2020), Disp: 4.5 mL, Rfl: 3  PAST MEDICAL HISTORY: Past Medical History:  Diagnosis Date  . Migraine   . Obesity     PAST SURGICAL HISTORY: Past Surgical History:  Procedure Laterality Date  . NO PAST SURGERIES      FAMILY HISTORY: No family history on file.  SOCIAL HISTORY:  Social History   Socioeconomic History  . Marital status: Single    Spouse name: Not on file  . Number of children: 0  .  Years of education: BA  . Highest education level: Not on file  Occupational History  . Occupation: on [employer]  Tobacco Use  . Smoking status: Never Smoker  . Smokeless tobacco: Never Used  Substance and Sexual Activity  . Alcohol use: Yes    Comment: socially- 2 glasses per week or less  . Drug use: No  . Sexual activity: Not on file  Other Topics Concern  . Not on file  Social History Narrative   Right handed    Caffeine use: none   Social Determinants of Health   Financial Resource Strain:   . Difficulty of Paying Living Expenses: Not on file  Food Insecurity:   . Worried About 05/03/2020 in the Last Year: Not on file  . Ran Out of Food in the Last Year: Not on file  Transportation Needs:   . Lack of Transportation (Medical): Not on file  . Lack of Transportation (Non-Medical): Not on file  Physical Activity:   . Days of Exercise per Week: Not on file  . Minutes of Exercise per Session: Not on file  Stress:   . Feeling of Stress : Not on file  Social Connections:   . Frequency of Communication with Friends and Family: Not on file  . Frequency of Social Gatherings with Friends and Family: Not on file  . Attends Religious Services: Not on file  . Active Member of Clubs or Organizations: Not on file  . Attends Programme researcher, broadcasting/film/video Meetings: Not on file  . Marital Status: Not on file  Intimate Partner Violence:   . Fear of Current or Ex-Partner: Not on file  . Emotionally Abused: Not on file  . Physically Abused: Not on file  . Sexually Abused: Not on file     PHYSICAL EXAM  Vitals:   05/01/20 0854  BP: 117/85  Pulse: 75  Weight: 186 lb (84.4 kg)  Height: 5\' 7"  (1.702 m)    Body mass index is 29.13 kg/m.    General: The patient is well-developed and well-nourished and in no acute distress  Neurologic Exam  Mental status: The patient is alert and oriented x 3 at the time of the examination. The patient has apparent normal recent and remote memory,  with an apparently normal attention span and concentration ability.   Speech is normal.  Cranial nerves: Extraocular movements are full. Pupils are equal, round, and reactive to light and accomodation.   Reduced color vision OS.  Facial symmetry is present. There is good facial sensation to soft touch bilaterally.Facial strength is normal.  Trapezius and sternocleidomastoid strength is normal. No dysarthria is noted.  The tongue is midline, and the patient has symmetric elevation of the soft palate. No obvious hearing deficits are noted.  Motor:  Muscle bulk is normal.   Tone is normal. Strength is  5 / 5 in all 4 extremities.   Sensory: Sensory testing is intact to pinprick, soft touch and vibration sensation in all 4 extremities.  Coordination: Cerebellar testing reveals good finger-nose-finger and heel-to-shin bilaterally.  Gait and station: Station is normal.   Gait is normal. Tandem gait is mildly wide. Romberg is negative.   Reflexes: Deep tendon reflexes are symmetric and normal bilaterally.   Plantar responses are flexor.     ASSESSMENT AND PLAN  Relapsing remitting multiple sclerosis (HCC)  High risk medication use  Encounter for monitoring immunomodulating therapy  Vision disturbance  Left sided numbness  1.   Ms. 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  Additionally the MRI of the cervical spine shows multiple plaques consistent with MS. 2.   We had a long conversation about treatment options.  Due to having several plaques in the spinal cord, I consider her to be at increased risk of progressing at a higher rate.  We went over a few treatment options including Ocrevus,  Kesimpta, Tysabri and Zeposia.  We did discuss that if she thinks she might want to have children in the next couple of years that I would recommend one of the IV options.  She is going to give this more thought and we will check blood work for the various options. 3.   I will talk to her after we get all the blood work back or she should call sooner she has new or worsening neurologic symptoms.    Margurite Duffy A. Epimenio Foot, MD, [facility] 05/01/2020, 10:05 AM Certified in Neurology, Clinical Neurophysiology, Sleep Medicine and Neuroimaging  [facility] [address] [place], [place] [address] [phone]

## 2020-05-06 ENCOUNTER — Telehealth: Payer: Self-pay | Admitting: Neurology

## 2020-05-06 NOTE — Telephone Encounter (Signed)
Dr. Sater- what would you recommend? 

## 2020-05-06 NOTE — Telephone Encounter (Signed)
Pt called had blood drawn on 8/24. From that day to present have had numbness, feeling like a string is pulling from arm to fingers. Would like call from the nurse.

## 2020-05-06 NOTE — Telephone Encounter (Signed)
JCV ab drawn on 05/01/20 positive, index: 1.80

## 2020-05-07 LAB — CBC WITH DIFFERENTIAL/PLATELET
Basophils Absolute: 0 10*3/uL (ref 0.0–0.2)
Basos: 0 %
EOS (ABSOLUTE): 0 10*3/uL (ref 0.0–0.4)
Eos: 1 %
Hematocrit: 38.8 % (ref 34.0–46.6)
Hemoglobin: 11.9 g/dL (ref 11.1–15.9)
Immature Grans (Abs): 0 10*3/uL (ref 0.0–0.1)
Immature Granulocytes: 0 %
Lymphocytes Absolute: 2.2 10*3/uL (ref 0.7–3.1)
Lymphs: 43 %
MCH: 23.1 pg — ABNORMAL LOW (ref 26.6–33.0)
MCHC: 30.7 g/dL — ABNORMAL LOW (ref 31.5–35.7)
MCV: 75 fL — ABNORMAL LOW (ref 79–97)
Monocytes Absolute: 0.5 10*3/uL (ref 0.1–0.9)
Monocytes: 9 %
Neutrophils Absolute: 2.5 10*3/uL (ref 1.4–7.0)
Neutrophils: 47 %
Platelets: 321 10*3/uL (ref 150–450)
RBC: 5.16 x10E6/uL (ref 3.77–5.28)
RDW: 17.4 % — ABNORMAL HIGH (ref 11.7–15.4)
WBC: 5.2 10*3/uL (ref 3.4–10.8)

## 2020-05-07 LAB — QUANTIFERON-TB GOLD PLUS
QuantiFERON Mitogen Value: >10 IU/mL
QuantiFERON Nil Value: 0.02 IU/mL
QuantiFERON TB1 Ag Value: 0.06 IU/mL
QuantiFERON TB2 Ag Value: 0.05 IU/mL
QuantiFERON-TB Gold Plus: NEGATIVE

## 2020-05-07 LAB — HEPATITIS B CORE ANTIBODY, TOTAL: Hep B Core Total Ab: NEGATIVE

## 2020-05-07 LAB — COMPREHENSIVE METABOLIC PANEL
ALT: 12 IU/L (ref 0–32)
AST: 14 IU/L (ref 0–40)
Albumin/Globulin Ratio: 1.5 (ref 1.2–2.2)
Albumin: 4.5 g/dL (ref 3.8–4.8)
Alkaline Phosphatase: 40 IU/L — ABNORMAL LOW (ref 48–121)
BUN/Creatinine Ratio: 11 (ref 9–23)
BUN: 11 mg/dL (ref 6–20)
Bilirubin Total: 0.6 mg/dL (ref 0.0–1.2)
CO2: 21 mmol/L (ref 20–29)
Calcium: 10.2 mg/dL (ref 8.7–10.2)
Chloride: 108 mmol/L — ABNORMAL HIGH (ref 96–106)
Creatinine, Ser: 1 mg/dL (ref 0.57–1.00)
GFR calc Af Amer: 86 mL/min/{1.73_m2} (ref >59)
GFR calc non Af Amer: 75 mL/min/{1.73_m2} (ref >59)
Globulin, Total: 3 g/dL (ref 1.5–4.5)
Glucose: 92 mg/dL (ref 65–99)
Potassium: 5.1 mmol/L (ref 3.5–5.2)
Sodium: 141 mmol/L (ref 134–144)
Total Protein: 7.5 g/dL (ref 6.0–8.5)

## 2020-05-07 LAB — CYP2C9 GENOTYPING SIPONIMOD

## 2020-05-07 LAB — VARICELLA ZOSTER ANTIBODY, IGG: Varicella zoster IgG: 3939 index (ref >165)

## 2020-05-07 LAB — HIV ANTIBODY (ROUTINE TESTING W REFLEX): HIV Screen 4th Generation wRfx: NONREACTIVE

## 2020-05-07 LAB — HEPATITIS B SURFACE ANTIBODY,QUALITATIVE: Hep B Surface Ab, Qual: NONREACTIVE

## 2020-05-07 LAB — HEPATITIS B SURFACE ANTIGEN: Hepatitis B Surface Ag: NEGATIVE

## 2020-05-07 LAB — VITAMIN D 25 HYDROXY (VIT D DEFICIENCY, FRACTURES): Vit D, 25-Hydroxy: 37.4 ng/mL (ref 30.0–100.0)

## 2020-05-09 ENCOUNTER — Telehealth: Payer: Self-pay | Admitting: *Deleted

## 2020-05-09 NOTE — Telephone Encounter (Signed)
Faxed completed/signed Vumerity start form to [employer] at [phone]. Received fax confirmation.  

## 2020-05-09 NOTE — Telephone Encounter (Signed)
Pt came to office and signed start form for Vumerity.

## 2020-05-14 ENCOUNTER — Telehealth: Payer: Self-pay

## 2020-05-14 NOTE — Telephone Encounter (Signed)
Received notice from [employer] that the QuickStart Shipment for vumerity was sent to [facility] for complimentary shipment of free drug while waiting for insurance approval.

## 2020-05-14 NOTE — Telephone Encounter (Signed)
We are aware. PA from [facility] has been completed. Waiting for Dr. Bonnita Hollow review and signature.

## 2020-05-14 NOTE — Telephone Encounter (Signed)
Lupita Leash with [facility] calling to make [place] aware that the patient's Vumerity needs a PA asap.   [facility] PA Dept number [phone]  Patients pharmacy benefit ID: [id number]

## 2020-05-14 NOTE — Telephone Encounter (Signed)
error 

## 2020-05-14 NOTE — Telephone Encounter (Signed)
PA for vumerity completed, signed by Dr. Epimenio Foot. Faxed to [facility]. Received a receipt of confirmation.

## 2020-05-15 NOTE — Telephone Encounter (Signed)
PA for vumerity was approved by [facility] from 05/14/2020-05/14/2021. PA # [id number]. Crystal at [employer] was informed.

## 2020-05-21 NOTE — Telephone Encounter (Signed)
Reached out to [employer] at [employer] for an update on pt's Vumerity. Received this response: "nothing else needed from you guys ?? She has a $65 copay so we are waiting to hear back from the patient to see if she would like to be screened for financial assistance."

## 2020-05-27 NOTE — Telephone Encounter (Signed)
I have reached out to [employer] again to check the status of pt's vumerity.

## 2020-05-27 NOTE — Telephone Encounter (Signed)
Pt returned call. States she has several questions for RN. Please advise.

## 2020-05-27 NOTE — Telephone Encounter (Addendum)
I spoke with Dr. Epimenio Foot. Anxiety should not be made worse on vumerity. OTC supplements are fine. Asprin prior to dosing is fine.   I called pt and discussed this with her. She will let me know of questions or concerns. Pt has already spoken with Stanton Kidney in [place] regarding FMLA.

## 2020-05-27 NOTE — Telephone Encounter (Signed)
Received this notice from [employer]: "Her QuickStart doses shipped 9/9. We have reached out to her to advise [facility] is ready to ship with a $65 copay b& offer a transfer to financial assistance.  She has been unreachable. I will continue to follow up."

## 2020-05-27 NOTE — Telephone Encounter (Signed)
I called pt to discuss. No answer, left a message asking her to call me back.  I asked Crystal which phone number pt should call to discuss the copay and received this message :"[phone]; our financial assistance team is available Monday - Friday, 8:30 am - 8:00 pm EST"

## 2020-05-27 NOTE — Telephone Encounter (Signed)
I called pt. She has a couple questions.  1. She has noticed her anxiety is perhaps worse on vumerity. She has anxiety issues prior to starting but she is wondering if it can be made worse on vumerity.  2. She takes OTC supplements and wants to know if they are ok to take while on vumerity.  3. She has inconsistent meals and wants to know if it is ok to take aspirin 30 minutes prior to her vumerity if she is unable to eat prior to the vumerity.  4. She wants to know if Dr. Epimenio Foot will fill out intermittent FMLA forms for her.  5. Pt is ok with the $65 copay from [facility] and does not need patient assistance.  I will discuss her concerns with Dr. Epimenio Foot and call her back.

## 2020-06-10 ENCOUNTER — Telehealth: Payer: Self-pay | Admitting: *Deleted

## 2020-06-10 NOTE — Telephone Encounter (Signed)
I called pt to give update on her FLMA form. Pt form is still on doctors desk.

## 2020-06-11 NOTE — Telephone Encounter (Signed)
Gave completed/signed FMLA form back to medical records to process for pt. Dr. Epimenio Foot providing intermittent leave for possible MS flare ups. 1x q 3 months (1-5days per episode).

## 2020-06-12 ENCOUNTER — Telehealth: Payer: Self-pay | Admitting: Neurology

## 2020-06-12 NOTE — Telephone Encounter (Signed)
FMLA form faxed to [employer] at [phone] on 06/12/2020.

## 2020-06-13 ENCOUNTER — Telehealth: Payer: Self-pay | Admitting: Neurology

## 2020-06-13 MED ORDER — ONDANSETRON HCL 4 MG PO TABS: ORAL_TABLET | ORAL | 0 refills | Status: DC

## 2020-06-13 NOTE — Telephone Encounter (Addendum)
Called pt back. She started vomiting last Friday. Thought she ate something bad the day before but sx are still present. She is unable to keep anything down. Gets headache or stomach pain when she eats. Cannot eat whole meal in order to take Vumerity. She is nauseous/coughing. She took Vumerity last night. Denies starting any other new meds. No exposure to anyone with covid-19 that she is aware of. Started vumeirty about 3-4 weeks ago. No fever per pt. She has not taken dose today for Vumerity. Going to try and eat some soup this morning first. Advised I will discuss with MD and call her back.  Spoke with Dr. Epimenio Foot. He recommends the following: 1. zofran 4mg , 1 po TID prn #30, no refills for nausea.  2. Skip doses for vumerity today and tomorrow. Then start medication again as follows: one pill per day x1 week, then 1 pill twice daily for 1 week, then 1 pill in the am and 2 pill in the pm for 1 wk, then go up to 2 pills twice daily thereafter. If she continues to have SE after this, she should call to get appt to discuss other DMT options.  I called pt back. Relayed above recommendations. She is agreeable to this plan. She wrote down directions for how to titrate back on the vumerity. [facility] Advised her to call back if sx do not improve.

## 2020-06-13 NOTE — Telephone Encounter (Signed)
Pt called stating that since Friday she has been vomiting and is not able to keep her Diroximel Fumarate (VUMERITY PO) or anything else down. Pt would like to know if this has to do with the medication. Please advise.

## 2020-06-20 ENCOUNTER — Other Ambulatory Visit: Payer: Self-pay | Admitting: Neurology

## 2020-07-01 ENCOUNTER — Telehealth: Payer: Self-pay | Admitting: *Deleted

## 2020-07-01 NOTE — Telephone Encounter (Signed)
Submitted PA Ajovy on CMM. Key: [id number] Case ID: [id number]. Waiting on determination from [facility].

## 2020-07-02 NOTE — Telephone Encounter (Signed)
Received fax from [facility] that PA approved 07/01/20-07/01/21. PA#[employer] [phone]

## 2020-07-23 ENCOUNTER — Telehealth: Payer: Self-pay | Admitting: Neurology

## 2020-07-23 NOTE — Telephone Encounter (Signed)
Called and spoke with pt. [place] and may have missed their call. I provided phone # for her to call them back at to process refill. Reminded her that her next f/u is 09/30/20 at 1:00pm with Dr. Epimenio Foot. She would like to discuss changing DMT at this appt. She also has reported that she has not had a period in a month. Has had a negative pregnancy test. She is going to follow up with OB on this.

## 2020-07-23 NOTE — Telephone Encounter (Signed)
Michele Krause @ [facility] has called to report they are closing the order on pt's Vumerity until they hear from her.  They have not been able tomake contact with pt.  They are asking if [employer] speaks with pt to have her to call them to resume processing the order for Vumerity .  If there are questions [facility] can be reached at [phone]

## 2020-09-04 ENCOUNTER — Telehealth: Payer: Self-pay | Admitting: Neurology

## 2020-09-04 NOTE — Telephone Encounter (Signed)
I returned the call to the patient. Over the last week, she reports being sick. She has been experiencing the following symptoms: nausea, vomiting, congestion, chest pressure/tightness. She missed two doses last week of her Vumerity because of her vomiting (which has now resolved). She forgot that she had ondansetron at home that helped greatly. However, she is still not feeling well overall. She was instructed to contact her PCP to rule out infection. She was in agreement with this plan and will call today.

## 2020-09-04 NOTE — Telephone Encounter (Signed)
Pt. Called this morning stating that she needs to speak with RN about her MS, she is having body aches and is concerned her meds are not working anymore. Best call back is [phone].

## 2020-09-30 ENCOUNTER — Encounter: Payer: Self-pay | Admitting: Neurology

## 2020-09-30 ENCOUNTER — Ambulatory Visit (INDEPENDENT_AMBULATORY_CARE_PROVIDER_SITE_OTHER): Payer: Managed Care, Other (non HMO) | Admitting: Neurology

## 2020-09-30 VITALS — BP 121/76 | HR 79 | Wt 192.5 lb

## 2020-09-30 DIAGNOSIS — F32A Depression, unspecified: Secondary | ICD-10-CM

## 2020-09-30 DIAGNOSIS — Z79899 Other long term (current) drug therapy: Secondary | ICD-10-CM

## 2020-09-30 DIAGNOSIS — R4189 Other symptoms and signs involving cognitive functions and awareness: Secondary | ICD-10-CM | POA: Diagnosis not present

## 2020-09-30 DIAGNOSIS — G35 Multiple sclerosis: Secondary | ICD-10-CM | POA: Diagnosis not present

## 2020-09-30 DIAGNOSIS — R200 Anesthesia of skin: Secondary | ICD-10-CM

## 2020-09-30 DIAGNOSIS — H532 Diplopia: Secondary | ICD-10-CM | POA: Diagnosis not present

## 2020-09-30 MED ORDER — SERTRALINE HCL 50 MG PO TABS: 50.0000 mg | ORAL_TABLET | Freq: Every day | ORAL | 4 refills | Status: DC

## 2020-09-30 NOTE — Progress Notes (Signed)
[facility]  PATIENT: Normalee Sistare DOB: Feb 12, 1986  REFERRING DOCTOR OR PCP: Mitzi Hansen, MD SOURCE: Patient, notes from primary care, imaging laboratory reports, MRI images personally reviewed.  _________________________________   HISTORICAL  CHIEF COMPLAINT:  Chief Complaint  Patient presents with  . Follow-up    RM 12, alone. Last seen 05/01/20. On Vumerity for MS/ Ajovy for migraines. States migraines still occur, but not as frequently. Last one last week that lasted 2-3 days. States Vumerity was a big lifestyle change, having to take two doses.  Wants to discuss if she qualifies for LTD. Having more cognitive issues, cannot focus.  Just got engaged 2 weeks ago, has not set a date.    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  We went over options.  She was diagnosed with MS July 2021 but had symptoms on several occasion over the last 3 years.  She had the onset of left sided numbness in early June 2021 from shoulder to leg that she notes if she sits a while or stands a while.  In April 2021, she noted headache and reduced left sided vision.     Since 2019,  she notes a feeling like she will topple over if she stands on one spot.   She has had some intermittent diplopia since then.  Gait is stable.  She denies any weakness.  There is no fixed numbness.  She does continue to have diplopia at times.  She is noting more difficulty with her focus and attention. Sometimes she has trouble coming up with the right words.  Memory is ok.    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   She notes mild depression,  She was once on Wellbutrin.  She has never been on an SSRI.    She is on birth control currently.    She is not planning for a  pregnancy in the next couple years.     She is on zonisamide with Ajovy shots.  HA improved going up to 150 mg zonisamide.       She takes Vit D supplements  She just got the Covid vaccination x 2 June/July 2021.     MS History Benelli Labine had difficulty with frequent migraine headaches that became daily around March 2021.  She had an MRI 03/09/2020 showing changes c/w MS and was referred to me.   On further review, she has had some neurologic symptoms.    She had the onset of left sided numbness in early June 2021 from shoulder to leg that she notes if she sits a while or stands a while.  In April 2021, she noted headache and reduced left sided vision (and had reduced color visoin OS on exam 03/2020).   She also notes her balance has been poor since she was younger.   Since 2019, though, she notes a feeling like she will topple over if she stands on one spot.     I personally reviewed the MRI of the brain dated 03/09/2020.  It shows multiple T2/FLAIR hyperintense foci.  2 are located in the right middle cerebellar peduncle and adjacent cerebellum others are located in the hemispheres, some in the periventricular white matter and some in  the deep white matter.  Sagittal images were not performed.  Contrast was not performed but two foci in the periventricular white matter were mildly bright on diffusion-weighted images.   The MRI of the cervical spine MRI 04/21/2020 showed 3 small T2 hyperintense foci within the spinal cord, adjacent to C3, C4-C5 and C6-C7 as detailed above.  None of these enhance or appear to be acute.  These are consistent with chronic demyelinating plaque associated with multiple sclerosis.     MRI brain 04/21/2020 showed multiple T2/FLAIR hyperintensity involving right cerebellum, subtle brainstem, periventricular and pericallosal regions with suggestive of demyelinating disease.  No enhancing lesions are noted.  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    Headache history: She is currently on zonisamide and has had trigger point injections for her headaches.  The higher dose of zonisamide has helped the headache more.  She gets nausea, photophobia and phonophobia.   She takes sumatriptan or baclofen when a headache occurs.   This only helps the stabbing pain but does not make the headache go away.    For chronic migraines she has tried and failed anti-epileptic (zonisamide), muscle relaxant (baclofen) and triptans (sumatriptrans).  I gave her a prescription for Ajovy that she has filled.  She has not yet started.   REVIEW OF SYSTEMS: Constitutional: No fevers, chills, sweats, or change in appetite Eyes: No visual changes, double vision, eye pain Ear, nose and throat: No hearing loss, ear pain, nasal congestion, sore throat Cardiovascular: No chest pain, palpitations Respiratory: No shortness of breath at rest or with exertion.   No wheezes GastrointestinaI: No nausea, vomiting, diarrhea, abdominal pain, fecal incontinence Genitourinary: No dysuria, urinary retention or frequency.  No nocturia. Musculoskeletal: No neck pain, back pain Integumentary: No rash, pruritus, skin lesions Neurological: as above Psychiatric: No depression at this time.  No anxiety Endocrine: No palpitations, diaphoresis, change in appetite, change in weigh or increased thirst Hematologic/Lymphatic: No anemia, purpura, petechiae. Allergic/Immunologic: No itchy/runny eyes, nasal congestion, recent allergic reactions, rashes  ALLERGIES: No Known Allergies  HOME MEDICATIONS:  Current Outpatient Medications:  .  baclofen (LIORESAL) 10 MG tablet, Take 1 tablet by mouth every 30 (thirty) days., Disp: , Rfl:  .  Diroximel Fumarate (VUMERITY PO), Take by mouth., Disp: , Rfl:  .  Fremanezumab-vfrm (AJOVY) 225 MG/1.5ML SOSY, Inject 1 Syringe into the skin every 28 (twenty-eight) days., Disp: 4.5 mL, Rfl: 3 .   ibuprofen (ADVIL,MOTRIN) 200 MG tablet, Take 400 mg by mouth every 6 (six) hours as needed for headache., Disp: , Rfl:  .  KAITLIB FE 0.8-25 MG-MCG tablet, 1 tablet daily., Disp: , Rfl:  .  LINZESS 145 MCG CAPS capsule, Take 145 mcg by mouth daily., Disp: , Rfl:  .  Multiple Vitamin (MULTIVITAMIN WITH MINERALS) TABS tablet, Take 1 tablet by mouth daily. Herbal Life Multivitamin, Disp: , Rfl:  .  NON FORMULARY, Take 1 capsule by mouth daily. Herbal Life Cell-U-Loss, Disp: , Rfl:  .  NON FORMULARY, Take 1 capsule by mouth daily. Herbal Life Total Control, Disp: , Rfl:  .  Norethindrone-Ethinyl Estradiol-Fe (GENERESSE) 0.8-25 MG-MCG tablet, Chew 1 tablet by mouth daily., Disp: , Rfl:  .  ondansetron (ZOFRAN) 4 MG tablet, Take 1 tablet by mouth three times daily as needed, Disp: 30 tablet, Rfl: 0 .  OVER THE COUNTER MEDICATION, Take 1 capsule by mouth daily. Herbal Life Cell Activator, Disp: ,  Rfl:  .  sertraline (ZOLOFT) 50 MG tablet, Take 1 tablet (50 mg total) by mouth daily., Disp: 90 tablet, Rfl: 4 .  SUMAtriptan (IMITREX) 100 MG tablet, Take 100 mg by mouth as needed., Disp: , Rfl:  .  VUMERITY 231 MG CPDR, TAKE 2 CAPSULES BY MOUTH 2 TIMES A DAY, Disp: 360 capsule, Rfl: 3 .  zonisamide (ZONEGRAN) 100 MG capsule, Take 100 mg by mouth daily. Will be going to 150mg  po qd, Disp: , Rfl:   PAST MEDICAL HISTORY: Past Medical History:  Diagnosis Date  . Migraine   . Obesity     PAST SURGICAL HISTORY: Past Surgical History:  Procedure Laterality Date  . NO PAST SURGERIES      FAMILY HISTORY: No family history on file.  SOCIAL HISTORY:  Social History   Socioeconomic History  . Marital status: Single    Spouse name: Not on file  . Number of children: 0  . Years of education: BA  . Highest education level: Not on file  Occupational History  . Occupation: on [employer]  Tobacco Use  . Smoking status: Never Smoker  . Smokeless tobacco: Never Used  Substance and Sexual Activity  . Alcohol  use: Yes    Comment: socially- 2 glasses per week or less  . Drug use: No  . Sexual activity: Not on file  Other Topics Concern  . Not on file  Social History Narrative   Right handed    Caffeine use: none   Social Determinants of Health   Financial Resource Strain: Not on file  Food Insecurity: Not on file  Transportation Needs: Not on file  Physical Activity: Not on file  Stress: Not on file  Social Connections: Not on file  Intimate Partner Violence: Not on file     PHYSICAL EXAM  Vitals:   09/30/20 1300  BP: 121/76  Pulse: 79  Weight: 192 lb 8 oz (87.3 kg)    Body mass index is 30.15 kg/m.    General: The patient is well-developed and well-nourished and in no acute distress  Neurologic Exam  Mental status: The patient is alert and oriented x 3 at the time of the examination. The patient has apparent normal recent and remote memory, with an mildly reduced attention span and concentration ability.   Speech is normal but a few pauses coming up with words..  Cranial nerves: Extraocular movements are full.   Now has symmetric color vision..  Facial symmetry is present. There is good facial sensation to soft touch bilaterally.Facial strength is normal.  Trapezius and sternocleidomastoid strength is normal. No dysarthria is noted.  No obvious hearing deficits are noted.  Motor:  Muscle bulk is normal.   Tone is normal. Strength is  5 / 5 in all 4 extremities.   Sensory: Sensory testing is intact to pinprick, soft touch and vibration sensation in all 4 extremities.  Coordination: Cerebellar testing reveals good finger-nose-finger and heel-to-shin bilaterally.  Gait and station: Station is normal.   Gait is normal. Tandem gait is mildly wide. Romberg is negative.   Reflexes: Deep tendon reflexes are symmetric and normal bilaterally.   Plantar responses are flexor.     ASSESSMENT AND PLAN  Relapsing remitting multiple sclerosis (HCC) - Plan: Ambulatory referral to  Neuropsychology  Disturbed cognition - Plan: Ambulatory referral to Neuropsychology  High risk medication use  Diplopia  Numbness  Depression, unspecified depression type  1.   She has more difficulty with work - will have her  do neuropsychologic testing.   2.   She would like to switch to a once a day pill.   Will set up Zeposia or Mayzent.  She is considering Zeposia open label drug study.    3.   Sertraline 50 mg.   4.   I will talk to her after we get all the blood work back or she should call sooner she has new or worsening neurologic symptoms.  45-minute office visit with the majority of the time spent face-to-face for history and physical, discussion/counseling and decision-making.  Additional time with record review and documentation.   Giorgio Chabot A. Epimenio Foot, MD, [facility] 09/30/2020, 4:40 PM Certified in Neurology, Clinical Neurophysiology, Sleep Medicine and Neuroimaging  [facility] [address], [address] [place], [place] [address] [phone]

## 2020-10-12 ENCOUNTER — Telehealth: Payer: Self-pay | Admitting: Neurology

## 2020-10-12 NOTE — Telephone Encounter (Signed)
No new neurologic symptoms.    EDSS = 2.0    (Cognitive/mentation 2; cerebellar/tandem 1)

## 2020-10-14 ENCOUNTER — Encounter: Payer: Self-pay | Admitting: Psychology

## 2020-10-24 ENCOUNTER — Other Ambulatory Visit: Payer: Self-pay | Admitting: Neurology

## 2020-10-24 DIAGNOSIS — G35 Multiple sclerosis: Secondary | ICD-10-CM

## 2020-12-23 IMAGING — MR MR HEAD W/O CM
10 series · 48 of 48 positions shown · non-contrast
Comparison: None.

CLINICAL DATA: 34-year-old female with new persistent daily
headaches. Migraines with progressive symptoms over the past 3
months. No known injury.

EXAM:
MRI HEAD WITHOUT CONTRAST
TECHNIQUE: Multiplanar, multiecho pulse sequences of the brain and surrounding
structures were obtained without intravenous contrast.

[Series 2: T1 · sagittal · 5.0mm · 0.45mm/px · 3 of 21 slices shown]
[im 1/21]
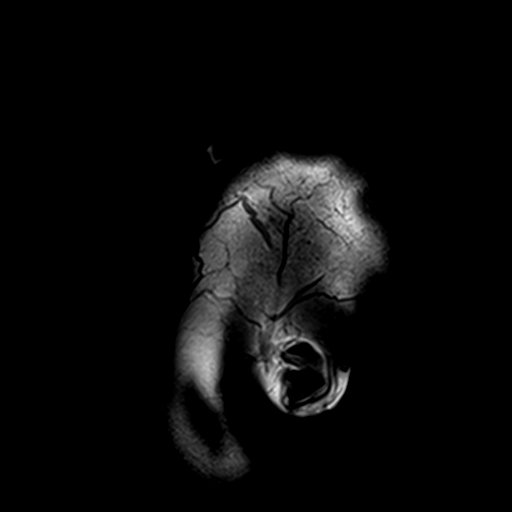
[im 11/21]
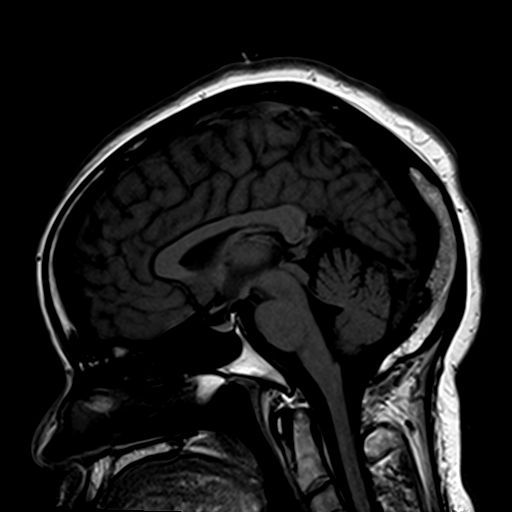
[im 21/21]
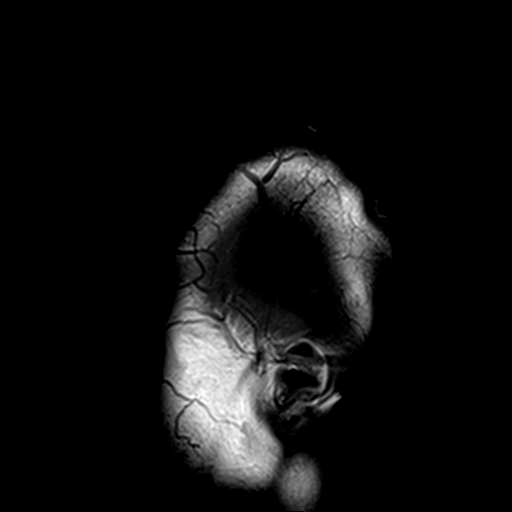

[Series 3: DWI · axial · 3.0mm · 1.80mm/px · z∈[-59,+86]mm · 9 of 100 slices shown (1 of 4)]
[im 1/100]
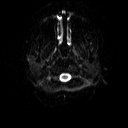
[im 13/100]
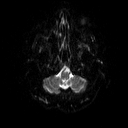
[im 25/100]
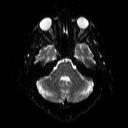
[im 38/100]
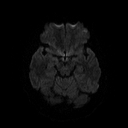
[im 50/100]
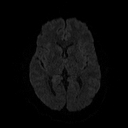
[im 62/100]
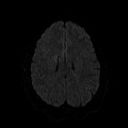
[im 75/100]
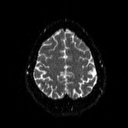
[im 87/100]
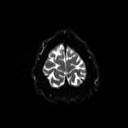
[im 100/100]
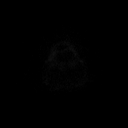

[Series 4: DWI · axial · 3.0mm · 1.80mm/px · z∈[-59,+86]mm · 4 of 46 slices shown (2 of 4)]
[im 1/46]
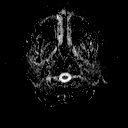
[im 16/46]
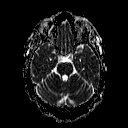
[im 31/46]
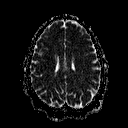
[im 46/46]
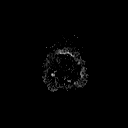

[Series 5: DWI · coronal · 5.0mm · 1.80mm/px · 6 of 67 slices shown (3 of 4)]
[im 1/67]
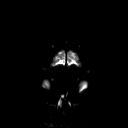
[im 14/67]
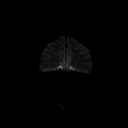
[im 27/67]
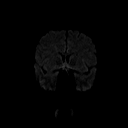
[im 40/67]
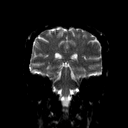
[im 53/67]
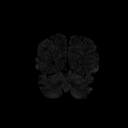
[im 67/67]
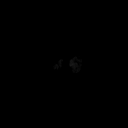

[Series 6: DWI · coronal · 5.0mm · 1.80mm/px · 3 of 34 slices shown (4 of 4)]
[im 1/34]
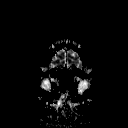
[im 17/34]
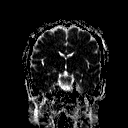
[im 34/34]
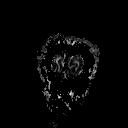

[Series 7: T2 · axial · 5.0mm · 0.60mm/px · z∈[-64,+81]mm · 2 of 22 slices shown (1 of 2)]
[im 1/22]
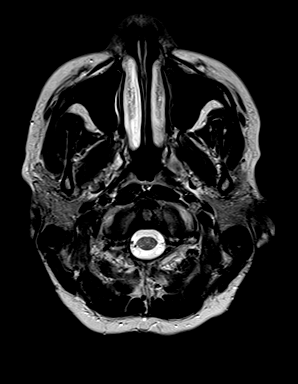
[im 22/22]
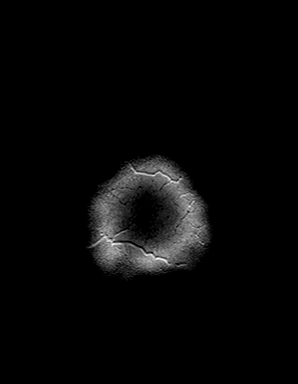

[Series 8: FLAIR · axial · 3.0mm · 0.45mm/px · z∈[-58,+75]mm · 3 of 30 slices shown]
[im 1/30]
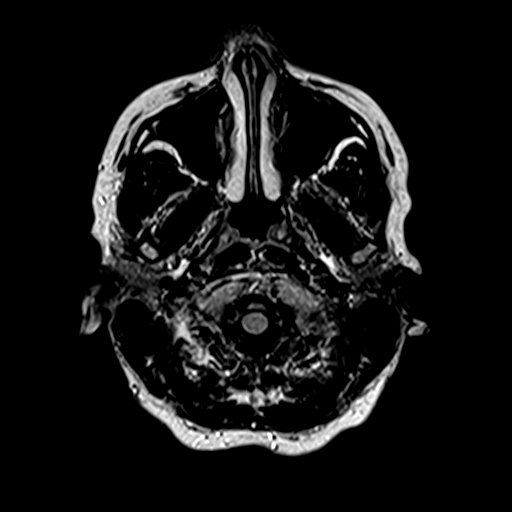
[im 15/30]
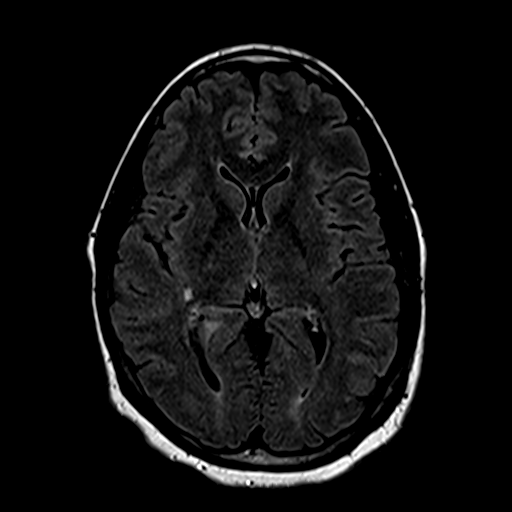
[im 30/30]
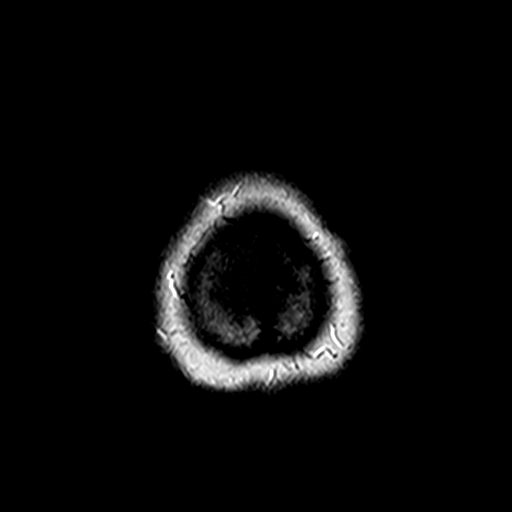

[Series 10: swi_images · axial · 4.0mm · 0.90mm/px · z∈[-61,+78]mm · 3 of 36 slices shown]
[im 1/36]
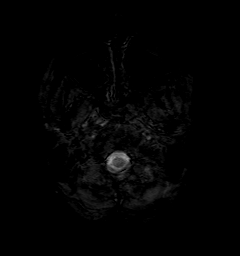
[im 18/36]
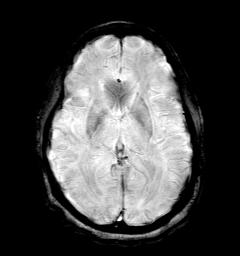
[im 36/36]
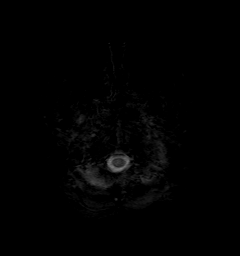

[Series 11: t1_mpr_tra · axial · 1.0mm · 0.71mm/px · z∈[-62,+79]mm · 13 of 144 slices shown]
[im 1/144]
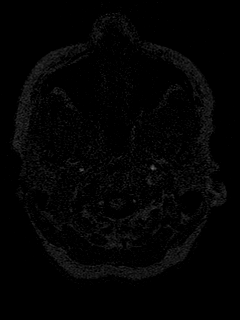
[im 12/144]
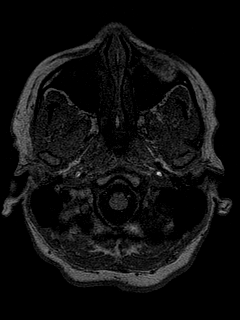
[im 24/144]
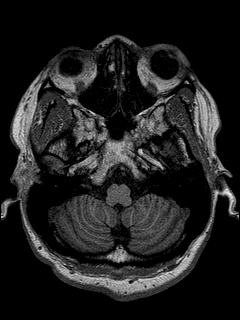
[im 36/144]
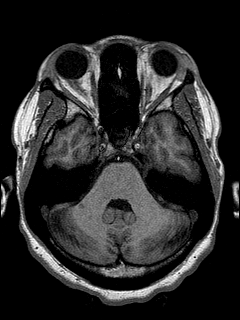
[im 48/144]
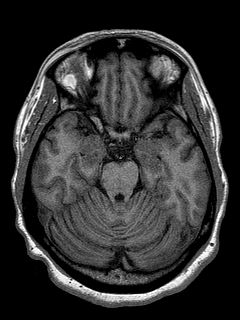
[im 60/144]
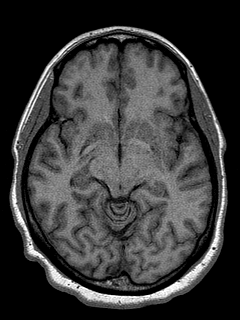
[im 72/144]
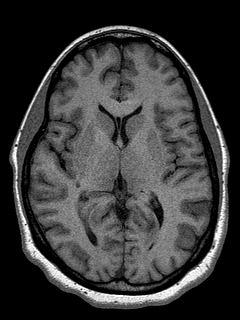
[im 84/144]
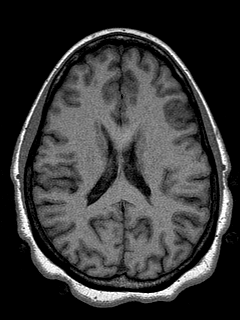
[im 96/144]
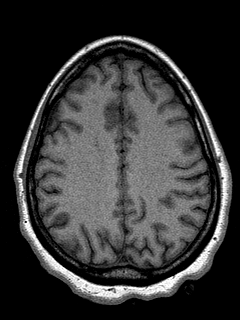
[im 108/144]
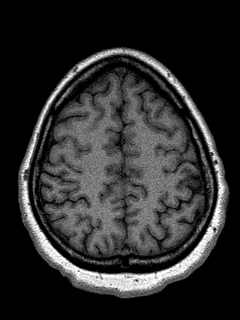
[im 120/144]
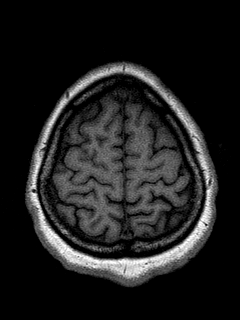
[im 132/144]
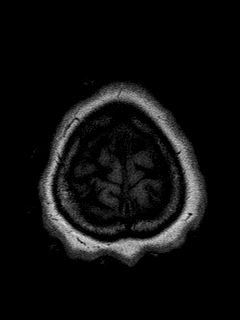
[im 144/144]
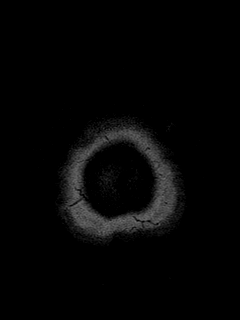

[Series 12: T2 · coronal · 5.0mm · 0.45mm/px · 2 of 25 slices shown (2 of 2)]
[im 1/25]
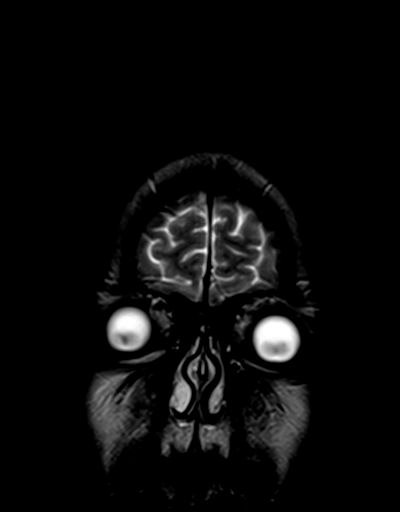
[im 25/25]
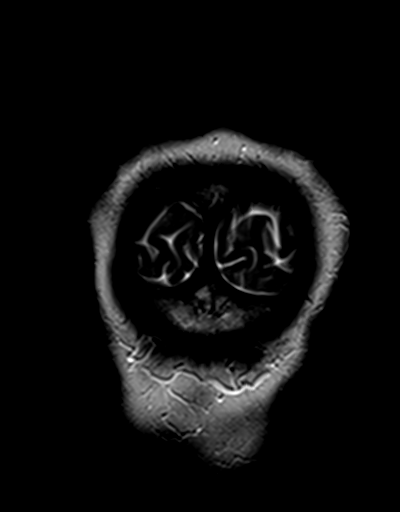

[48 of 48 positions shown; findings below may reference images not displayed]

FINDINGS: Brain: There is abnormal trace diffusion in the right
periventricular white matter at the corona radiata (series 3, image
82), but this appears isointense to mildly facilitated on ADC.
Associated nodular T2 and FLAIR hyperintensity (series 8, image 20).
Similar faint diffusion abnormality in the left periatrial white
matter lesion (series 3, image 78 and series 8, image 17). And there
are other scattered more subtle periventricular white matter lesions
without abnormal diffusion (series 8, image 15). There is faint
temporal lobe involvement on the left. And there is also abnormal T2
and FLAIR hyperintensity in the right cerebellum, including near the
peduncle (series 7, image 6).

Underlying normal cerebral volume. No restricted diffusion to
suggest acute infarction. No midline shift, mass effect, evidence of
mass lesion, ventriculomegaly, extra-axial collection or acute
intracranial hemorrhage. Cervicomedullary junction and pituitary are
within normal limits. The deep gray nuclei remain within normal
limits. Questionable mild T2 heterogeneity in the right midbrain. No
cortical encephalomalacia. No chronic cerebral blood products.

Vascular: Major intracranial vascular flow voids are preserved.

Skull and upper cervical spine: Grossly normal visible marrow
cervical spine signal. Normal bone

Sinuses/Orbits: Grossly negative orbits soft tissues, mild motion
artifact. Paranasal sinuses are clear.

Other: Mastoid air cells are clear. Visible internal auditory
structures appear normal. Scalp and face soft tissues are negative.
IMPRESSION: Scattered abnormal signal in the cerebral white matter and the right
cerebellum has a configuration and is associated with heterogeneous
diffusion strongly suggestive of Multiple Sclerosis. Possible
subacute demyelination in the bilateral periventricular white
matter.
Recommend follow-up with Neurology.

## 2021-01-28 ENCOUNTER — Encounter: Payer: Self-pay | Admitting: Psychology

## 2021-01-28 ENCOUNTER — Encounter: Payer: Managed Care, Other (non HMO) | Attending: Psychology | Admitting: Psychology

## 2021-01-28 ENCOUNTER — Other Ambulatory Visit: Payer: Self-pay

## 2021-01-28 DIAGNOSIS — R4189 Other symptoms and signs involving cognitive functions and awareness: Secondary | ICD-10-CM | POA: Diagnosis not present

## 2021-01-28 DIAGNOSIS — F321 Major depressive disorder, single episode, moderate: Secondary | ICD-10-CM | POA: Diagnosis not present

## 2021-01-28 DIAGNOSIS — G35 Multiple sclerosis: Secondary | ICD-10-CM | POA: Diagnosis not present

## 2021-01-28 DIAGNOSIS — R413 Other amnesia: Secondary | ICD-10-CM | POA: Diagnosis present

## 2021-01-28 DIAGNOSIS — H532 Diplopia: Secondary | ICD-10-CM

## 2021-01-28 NOTE — Progress Notes (Signed)
Neuropsychological Consultation   Patient:   Michele Krause   DOB:   1986-05-01  MR Number:  [id number]  Location:  [facility] MEDICINE [facility] PHYSICAL MEDICINE AND REHABILITATION [address] [place], [address] [id number] [facility] [place] [address] Dept: [phone]           Date of Service:   01/28/2021  Start Time:   9 AM End Time:   11 AM  Today's visit was an in person visit that was conducted in my outpatient clinic office.  The patient myself were present.  1 hour and 15 minutes was spent in formal clinical interview with the other 45 minutes spent with records review, report writing and setting up testing protocols.  Provider/Observer:  Arley Phenix, Psy.D.       Clinical Neuropsychologist       Billing Code/Service: [id number]  Chief Complaint:    [name] is a 35 year old female referred by [name], MD for neuropsychological consultation due to ongoing cognitive changes and emotional response due to the development of multiple sclerosis and resulting cognitive changes from demyelinating condition.  Due to frequent migraine headaches the patient had an MRI on 03/09/2020 showing changes consistent with multiple sclerosis.  She was referred for neurological care to [name] MD due to ongoing neurological symptoms including left-sided numbness, reduced left-sided vision, balance changes and changes in memory and attention.  Reason for Service:  [name] is a 35 year old female referred by [name], MD for neuropsychological consultation due to ongoing cognitive changes and emotional response due to the development of multiple sclerosis and resulting cognitive changes from demyelinating condition.  Due to frequent migraine headaches the patient had an MRI on 03/09/2020 showing changes consistent with multiple sclerosis.  She was referred for neurological care to [name] MD due to ongoing neurological symptoms  including left-sided numbness, reduced left-sided vision, balance changes and changes in memory and attention.  The patient describes the development of her symptoms that she now realizes had presented earlier and initially formally identified in the form of fatigue, vision changes and changes in motor function.  The patient had a history of migraines that had become very problematic and quite frequent.  She reports that she was initially fatigue and began discovering symptoms as they develop.  The patient took FMLA initially due to vision changes and then the week she was scheduled to return to work she was formally diagnosed with MS after her MRI.  The patient describes difficulties with attention and concentration making her work duties quite difficult and that she has great trouble getting through her work requirements.  The patient describes fatigue and "brain fog" attention and concentration difficulties, word finding difficulties and difficulties pulling up factual information.  She describes her short-term memory is problematic and that people will tell her things that she should know but cannot remember.  The patient reports on some occasions that cueing will help with retrieval but other times she just never remembers the information.  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  The patient describes ongoing difficulties related to memory issues, cramping and numbness in hands and feet, migraines and fatigue.  She reports that she has interrupted sleep as well.  Word finding difficulties, short-term memory and attention and concentration deficits are noted.  The patient also describes coping difficulties including the development of depressive symptoms  and dealing with not only the resulting neurological and cognitive changes she is noting but also fear and worry about what may progress going  forward.  The patient had an MRI conducted on 04/21/2020 as well as 1 earlier on 03/09/2020.  These MRI studies have identified multiple T2/flair hyperintensities involving right cerebellum, subtle brainstem, periventricular and pericallosal regions with suggestive of demyelinating disease.  There is no enhancing lesions noted.  The patient is also had abnormalities in her cervical spine noted in MRI.  There were 3 small T2 hyperintense foci within the spinal cord adjacent to C3, C4-C5 and C6-C7.  These were also felt to be consistent with chronic demyelinating plaque associated with MS.   Behavioral Observation: Shir Bergman  presents as a 35 y.o.-year-old Right African American Female who appeared her stated age. her dress was Appropriate and she was Well Groomed and her manners were Appropriate to the situation.  her participation was indicative of Appropriate behaviors.  There were physical disabilities noted that were mild and consistent with her description of motor and sensory changes.  she displayed an appropriate level of cooperation and motivation.     Interactions:    Active Appropriate  Attention:   abnormal and attention span appeared shorter than expected for age  Memory:   abnormal; remote memory intact, recent memory impaired  Visuo-spatial:  not examined  Speech (Volume):  normal  Speech:   normal; normal  Thought Process:  Coherent and Relevant  Though Content:  WNL; not suicidal and not homicidal  Orientation:   person, place, time/date and situation  Judgment:   Good  Planning:   Good  Affect:    Tearful  Mood:    Dysphoric  Insight:   Good  Intelligence:   high  Marital Status/Living: The patient was born and raised in [place] along with 5 siblings.  There were no major childhood illnesses or developmental delays noted.  The patient currently lives with her boyfriend and they have lived together for the past 5 years.  The patient has no  children.  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  She reports that she is always very good at this job and really enjoyed that and was quite good at reviewing medical records and going through complex information review.  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  Hobbies and interest have included traveling, reading, jewelry and learning history.  Substance Use:  No concerns of substance abuse are reported.  The patient reports very limited alcohol use consuming very small amounts 2-3 times a week.  Education:   The patient completed her bachelor's degree with a 3.2 GPA from [employer] focused on communications degree.  Extracurricular activities have included participating on the dance and step team as well as the color guard team.  Medical History:   Past Medical History:  Diagnosis Date  . Migraine   . Obesity          Patient Active Problem List   Diagnosis Date Noted  . 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 Depression 09/30/2020  . Relapsing remitting multiple sclerosis (HCC) 05/01/2020  . High risk medication use 05/01/2020  . Encounter for monitoring immunomodulating therapy 05/01/2020  . Diplopia 05/01/2020  . Numbness 05/01/2020     Psychiatric History:  The patient has no significant prior psychiatric history other than the development of depressive type symptoms with the  onset of her MS.  The patient has been started on Zoloft/sertraline by Dr. Epimenio Foot and she reports little side effects and feels like it is helping with some of her depressive symptomatology.  Family Med/Psych History: History reviewed. No pertinent family history.   Impression/DX:  Sebrena Engh is a 35 year old female referred by [name], MD for neuropsychological consultation due to ongoing cognitive changes and emotional response due to the development of multiple sclerosis and resulting cognitive  changes from demyelinating condition.  Due to frequent migraine headaches the patient had an MRI on 03/09/2020 showing changes consistent with multiple sclerosis.  She was referred for neurological care to [name] MD due to ongoing neurological symptoms including left-sided numbness, reduced left-sided vision, balance changes and changes in memory and attention.  The patient's symptoms are consistent with MS in the regions identified with MRI studies.  Disposition/Plan:  We have set the patient up initially for formal neuropsychological testing and will include a foundational battery of the Wechsler Adult Intelligence Scale and Wechsler Memory Scale's to provide a broad objective assessment of cognitive functioning.  We will also add measures of motor functioning and fine motor functioning including the grooved pegboard, hand dynamometer and finger tapping test.  Once these are completed a determination as to any other testing is warranted will be made.  The patient is also having a lot of struggles coping and adjusting to the cognitive and neurological impacts of her MS and we will also look at appropriate ways to address these features.  Once this evaluation is completed a formal report will be produced to Dr. Epimenio Foot for review with recommendations provided in both report as well as to the patient and a formal feedback.  Diagnosis:    Relapsing remitting multiple sclerosis (HCC)  Disturbed cognition  Current moderate episode of major depressive disorder without prior episode (HCC)  Diplopia  Memory loss due to medical condition         Electronically Signed   _______________________ Arley Phenix, Psy.D. Clinical Neuropsychologist

## 2021-02-20 ENCOUNTER — Encounter: Payer: Managed Care, Other (non HMO) | Attending: Psychology

## 2021-02-20 ENCOUNTER — Other Ambulatory Visit: Payer: Self-pay

## 2021-02-20 DIAGNOSIS — G35 Multiple sclerosis: Secondary | ICD-10-CM | POA: Insufficient documentation

## 2021-02-20 NOTE — Progress Notes (Signed)
Behavioral Observations  The patient was well groomed and appropriately dressed for her testing session. Her manners were polite, she was cooperative and demonstrated a positive attitude toward testing. The patient communicated that she was feeling well on the day of her test.   Neuropsychology Note  Michele Krause completed 180 minutes of neuropsychological testing with technician, Marica Otter, BA, under the supervision of Arley Phenix, PsyD., Clinical Neuropsychologist. The patient did not appear overtly distressed by the testing session, per behavioral observation or via self-report to the technician. Rest breaks were offered.   Clinical Decision Making: In considering the patient's current level of functioning, level of presumed impairment, nature of symptoms, emotional and behavioral responses during clinical interview, level of literacy, and observed level of motivation/effort, a battery of tests was selected by Dr. Kieth Brightly during initial consultation on 01/28/2021. This was communicated to the technician. Communication between the neuropsychologist and technician was ongoing throughout the testing session and changes were made as deemed necessary based on patient performance on testing, technician observations and additional pertinent factors such as those listed above.  Tests Administered: Controlled Oral Word Association Test (COWAT; FAS & Animals)  Wechsler Adult Intelligence Scale, 4th Edition (WAIS-IV) Wechsler Memory Scale, 4th Edition (WMS-IV); Adult Battery    Results:   Composite Score Summary  Scale Sum of Scaled Scores Composite Score Percentile Rank 95% Conf. Interval Qualitative Description  Verbal Comprehension 27 VCI 95 37 90-101 Average  Perceptual Reasoning 22 PRI 84 14 79-91 Low Average  Working Memory 17 WMI 92 30 86-99 Average  Processing Speed 14 PSI 84 14 77-94 Low Average  Full Scale 80 FSIQ 86 18 82-90 Low Average  General Ability 49 GAI 88 21  83-93 Low Average     Verbal Comprehension Subtests Summary  Subtest Raw Score Scaled Score Percentile Rank Reference Group Scaled Score SEM  Similarities 27 11 63 11 1.08  Vocabulary 36 10 50 10 0.79  Information 7 6 9 6  0.90  (Comprehension) 27 11 63 12 1.16       Perceptual Reasoning Subtests Summary  Subtest Raw Score Scaled Score Percentile Rank Reference Group Scaled Score SEM  Block Design 24 6 9 6  0.90  Matrix Reasoning 18 10 50 9 0.90  Visual Puzzles 9 6 9 6  0.95  (Figure Weights) 17 11 63 11 0.85  (Picture Completion) 11 8 25 8  1.12       Working Raw Score Scaled Score Percentile Rank Reference Group Scaled Score SEM  Digit Span 25 8 25 8  0.73  Arithmetic 13 9 37 9 0.95  (Letter-Number Seq.) 17 8 25 8  0.90       Processing Speed Subtests Summary  Subtest Raw Score Scaled Score Percentile Rank Reference Group Scaled Score SEM  Symbol Search 24 7 16 7  1.56  Coding 56 7 16 7  1.20  (Cancellation) 32 7 16 7  1.62     Index Score Summary  Index Sum of Scaled Scores Index Score Percentile Rank 95% Confidence Interval Qualitative Descriptor  Auditory Memory (AMI) 41 101 53 95-107 Average  Visual Memory (VMI) 33 89 23 84-95 Low Average  Visual Working Memory (VWMI) 17 91 27 84-99 Average  Immediate Memory (IMI) 32 86 18 80-93 Low Average  Delayed Memory (DMI) 42 104 61 97-111 Average     Primary Subtest Scaled Score Summary  Subtest Domain Raw Score Scaled Score Percentile Rank  Logical Memory I AM 27 11 63  Logical Memory II  AM 27 12 75  Verbal Paired Associates I AM 26 7 16   Verbal Paired Associates II AM 12 11 63  Designs I VM 62 8 25  Designs II VM 58 10 50  Visual Reproduction I VM 31 6 9   Visual Reproduction II VM 24 9 37  Spatial Addition VWM 13 9 37  Symbol Span VWM 22 8 25      Auditory Memory Process Score Summary  Process Score Raw Score Scaled Score Percentile Rank Cumulative Percentage (Base Rate)   LM II Recognition 23 - - 26-50%  VPA II Recognition 38 - - 17-25%       Visual Memory Process Score Summary  Process Score Raw Score Scaled Score Percentile Rank Cumulative Percentage (Base Rate)  DE I Content 34 9 37 -  DE I Spatial 20 12 75 -  DE II Content 36 10 50 -  DE II Spatial 18 14 91 -  DE II Recognition 14 - - 17-25%  VR II Recognition 5 - - 17-25%      ABILITY-MEMORY ANALYSIS  Ability Score:  VCI: 95 Date of Testing:  WAIS-IV; WMS-IV 2021/02/20  Predicted Difference Method   Index Predicted WMS-IV Index Score Actual WMS-IV Index Score Difference Critical Value  Significant Difference Y/N Base Rate  Auditory Memory 97 101 -4 10.15 N   Visual Memory 98 89 9 8.59 Y 25%  Visual Working Memory 97 91 6 10.56 N   Immediate Memory 97 86 11 9.78 Y 20%  Delayed Memory 97 104 -7 11.66 N       Feedback to Patient: Michele Krause will return for an interactive feedback session with Dr. at which time her test performances, clinical impressions and treatment recommendations will be reviewed in detail. The patient understands she can contact our office should she require our assistance before this time.  180 minutes spent face-to-face with patient administering standardized tests, 30 minutes spent scoring 2021/02/22). [CPT Michele Krause, [id number]]  Full report to follow.

## 2021-04-15 ENCOUNTER — Encounter: Payer: Managed Care, Other (non HMO) | Attending: Psychology | Admitting: Psychology

## 2021-04-15 ENCOUNTER — Encounter: Payer: Self-pay | Admitting: Psychology

## 2021-04-15 ENCOUNTER — Other Ambulatory Visit: Payer: Self-pay

## 2021-04-15 DIAGNOSIS — R413 Other amnesia: Secondary | ICD-10-CM | POA: Diagnosis present

## 2021-04-15 DIAGNOSIS — R4189 Other symptoms and signs involving cognitive functions and awareness: Secondary | ICD-10-CM | POA: Insufficient documentation

## 2021-04-15 DIAGNOSIS — F321 Major depressive disorder, single episode, moderate: Secondary | ICD-10-CM | POA: Diagnosis present

## 2021-04-15 DIAGNOSIS — G35 Multiple sclerosis: Secondary | ICD-10-CM | POA: Insufficient documentation

## 2021-04-15 NOTE — Progress Notes (Signed)
Neuropsychological Evaluation   Patient:  Michele Krause   DOB: Aug 15, 1986  MR Number: [id number]  Location: [facility] [place] [facility] [address] [place], [address] [id number] [facility] [place] [address] Dept: [phone]  Start: 10 AM End: 11 AM  Provider/Observer:     Hershal Coria PsyD  Chief Complaint:      Chief Complaint  Patient presents with   Memory Loss    Reason For Service:     Michele Krause is a 35 year old female referred by [name], MD for neuropsychological consultation due to ongoing cognitive changes and emotional response due to the development of multiple sclerosis and resulting cognitive changes from demyelinating condition.  Due to frequent migraine headaches the patient had an MRI on 03/09/2020 showing changes consistent with multiple sclerosis.  She was referred for neurological care to [name] MD due to ongoing neurological symptoms including left-sided numbness, reduced left-sided vision, balance changes and changes in memory and attention.   The patient describes the development of her symptoms that she now realizes had presented earlier and initially formally identified in the form of fatigue, vision changes and changes in motor function.  The patient had a history of migraines that had become very problematic and quite frequent.  She reports that she was initially fatigued and began discovering symptoms as they develop.  The patient took FMLA initially due to vision changes and then the week she was scheduled to return to work she was formally diagnosed with MS after her MRI.  The patient describes difficulties with attention and concentration making her work duties quite difficult and that she has great trouble getting through her work requirements.  The patient describes fatigue and "brain fog", attention and concentration difficulties, word finding difficulties and  difficulties pulling up factual information.  She describes her short-term memory is problematic and that people will tell her things that she should know but cannot remember.  The patient reports on some occasions that cueing will help with retrieval but other times she just never remembers the information.  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   The patient describes ongoing difficulties related to memory issues, cramping and numbness in hands and feet, migraines and fatigue.  She reports that she has interrupted sleep as well.  Word finding difficulties, short-term memory and attention and concentration deficits are noted.  The patient also describes coping difficulties including the development of depressive symptoms and dealing with not only the resulting neurological and cognitive changes she is noting but also fear and worry about what may progress going forward.   The patient had an MRI conducted on 04/21/2020 as well as 1 earlier on 03/09/2020.  These MRI studies have identified multiple T2/flair hyperintensities involving right cerebellum, subtle brainstem, periventricular and pericallosal regions with suggestive of demyelinating disease.  There is no enhancing lesions noted.  The patient is also had abnormalities in her cervical spine noted in MRI.  There were 3 small T2 hyperintense foci within the spinal cord adjacent to C3, C4-C5 and C6-C7.  These were also felt to be consistent with chronic demyelinating plaque associated with MS.  Tests Administered: Controlled Oral Word Association Test (COWAT; FAS & Animals) Wechsler Adult Intelligence Scale, 4th Edition (WAIS-IV) Wechsler Memory Scale, 4th Edition (WMS-IV); Adult Battery  Participation Level:   Active  Participation Quality:  Appropriate      Behavioral Observation:  Well Groomed, Alert, and Appropriate.   Test  Results:   Initially, an estimation was made as to historic/premorbid intellectual and cognitive functioning.  The patient graduated with her bachelor's degree from [employer] with a 3.2 GPA majoring in communications.  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  Hobbies and interest have included reading and learning history.  A conservative estimation of historic/premorbid intellectual functioning will be utilized of standard scores approximately 1 standard deviation above normative population related to roughly 110 T score values in comparison to norms on various cognitive domains although the patient likely had particular strengths and weaknesses.  Initially, the patient was administered the controlled oral Word Association test including animal naming and the FAS test.  The patient performed very well on these measures of verbal fluency performing consistent with normative expectation for abnormal/Target naming and performing quite well on the FAS test where she performed 1 and half standard deviations better than normative expectations based on education and age.  The patient correctly named 21 animals during the allotted time which is consistent with mean responses for her comparison group and she correctly identified 60 words within the FAS test allotted times.  Again, this was 1-1/2 standard deviations better than normative expectations.  Composite Score Summary         Scale Sum of Scaled Scores Composite Score Percentile Rank 95% Conf. Interval Qualitative Description  Verbal Comprehension 27 VCI 95 37 90-101 Average  Perceptual Reasoning 22 PRI 84 14 79-91 Low Average  Working Memory 17 WMI 92 30 86-99 Average  Processing Speed 14 PSI 84 14 77-94 Low Average  Full Scale 80 FSIQ 86 18 82-90 Low Average  General Ability 49 GAI 88  21 83-93 Low Average              Verbal Comprehension Subtests Summary      Subtest Raw Score Scaled Score Percentile Rank Reference Group Scaled Score SEM  Similarities 27 11 63 11 1.08  Vocabulary 36 10 50 10 0.79  Information 7 6 9 6  0.90  (Comprehension) 27 11 63 12 1.16                Perceptual Reasoning Subtests Summary      Subtest Raw Score Scaled Score Percentile Rank Reference Group Scaled Score SEM  Block Design 24 6 9 6  0.90  Matrix Reasoning 18 10 50 9 0.90  Visual Puzzles 9 6 9 6  0.95  (Figure Weights) 17 11 63 11 0.85  (Picture Completion) 11 8 25 8  1.12                Working Raw Score Scaled Score Percentile Rank Reference Group Scaled Score SEM  Digit Span 25 8 25 8  0.73  Arithmetic 13 9 37 9 0.95  (Letter-Number Seq.) 17 8 25 8  0.90                 Processing Speed Subtests Summary     Subtest Raw Score Scaled Score Percentile Rank Reference Group Scaled Score SEM  Symbol Search 24 7 16 7  1.56  Coding 56 7 16 7  1.20  (Cancellation) 32 7 16 7  1.62  Index Score Summary       Index Sum of Scaled Scores Index Score Percentile Rank 95% Confidence Interval Qualitative Descriptor  Auditory Memory (AMI) 41 101 53 95-107 Average  Visual Memory (VMI) 33 89 23 84-95 Low Average  Visual Working Memory (VWMI) 17 91 27 84-99 Average  Immediate Memory (IMI) 32 86 18 80-93 Low Average  Delayed Memory (DMI) 42 104 61 97-111 Average             Primary Subtest Scaled Score Summary      Subtest Domain Raw Score Scaled Score Percentile Rank  Logical Memory I AM 27 11 63  Logical Memory II AM 27 12 75  Verbal Paired Associates I AM 26 7 16   Verbal Paired Associates II AM 12 11 63  Designs I VM 62 8 25  Designs II VM 58 10 50  Visual Reproduction I VM 31 6 9   Visual Reproduction II VM 24 9 37  Spatial Addition VWM 13 9 37  Symbol Span VWM 22 8 25              Auditory Memory Process Score Summary       Process Score Raw Score Scaled Score Percentile Rank Cumulative Percentage (Base Rate)  LM II Recognition 23 - - 26-50%  VPA II Recognition 38 - - 17-25%              Visual Memory Process Score Summary     Process Score Raw Score Scaled Score Percentile Rank Cumulative Percentage (Base Rate)  DE I Content 34 9 37 -  DE I Spatial 20 12 75 -  DE II Content 36 10 50 -  DE II Spatial 18 14 91 -  DE II Recognition 14 - - 17-25%  VR II Recognition 5 - - 17-25%       ABILITY-MEMORY ANALYSIS   Ability Score:    VCI: 95 Date of Testing:           WAIS-IV; WMS-IV 2021/02/20            Predicted Difference Method    Index Predicted WMS-IV Index Score Actual WMS-IV Index Score Difference Critical Value   Significant Difference Y/N Base Rate  Auditory Memory 97 101 -4 10.15 N    Visual Memory 98 89 9 8.59 Y 25%  Visual Working Memory 97 91 6 10.56 N    Immediate Memory 97 86 11 9.78 Y 20%  Delayed Memory 97 104 -7 11.66 N      Summary of Results:   Overall, the results of the objective neuropsychological assessment assessing a broad range of various cognitive domains pertinent to the patient's reports of changes in memory and cognitive functioning suggest a mild decrease in overall global cognitive functioning.  It is estimated that the patient was performing in the high average range relative to a normative population in her current full-scale IQ and general abilities index score were in the low average range suggesting 1 or more cognitive domains have experienced a significant decline in functioning.  Generally speaking right hemispheric function showed greater impairments than left hemispheric functions consistent with her descriptions of physical changes including more left-sided numbness and changes primarily in left-sided vision.  The patient displays greater cognitive deficits for measures primarily controlled by visual spatial and visual base skills versus auditory base skills.   The patient displayed good expressive language functioning for both fluency and targeted naming as well as good retention of verbal based skills included  verbal reasoning and verbal based knowledge.  The patient showed significant decline in deficits for visual spatial and visual analysis types of skills consistent with visual constructional and visual spatial difficulties.  However, she showed well-preserved aspects of visual reasoning and problem-solving capacity.  The patient showed significant deficits related to visual scanning, visual searching and overall speed of mental operations.  There were clear objective findings of memory difficulties primarily for visual memory and learning and while her auditory memory was below predicted levels it was generally much better preserved.  There were mild decreases in auditory encoding as well as visual encoding and only mild reductions in auditory memory and learning.  Significant deficits relative to predicted levels were seen for visual memory and learning.  However, even with her visual memory deficits the patient displayed good ability to retain what was initially learned after period of delay.  This suggest that what information is actually encoded and stored is available for later recall.  Cueing and recognition formats did seem to improve functioning but this was primarily for auditory memory and learning and less so for visual memory and learning.  This pattern is consistent with greater deficits for right hemisphere functioning versus left hemisphere functioning for memory as well.  Impression/Diagnosis:   Overall, the results of the current neuropsychological evaluation are consistent with the patient's subjective reports.  She describes increasing memory deficits as well as changes in executive functioning, brain fog, attention and concentration difficulties, word finding difficulties and difficulties pulling up factual information.  The patient describes  short-term memory difficulties and difficulties with learning new information.  Objective assessment are consistent with significant reductions in information processing speed, visual scanning and visual searching abilities, mild deficits with regard to attention and concentration in the encoding domain, and difficulties with pulling up factual information.  However, her word finding and verbal fluency abilities appear to be well preserved.  While there was a global reduction in memory and learning the patient's greatest memory deficits had to do with problems with storage and organization of visual based information greater than verbal based information.  As far as diagnostic considerations, the results of the current neuropsychological evaluation are consistent with primary deficits in subcortical and white matter regions more than cortical regions.  Reduced information processing speed, difficulties retrieving information and measures that require integration and coordination between multiple brain regions were all noted.  There were indications of greater difficulties for right hemispheric functioning versus left hemispheric functioning.  The results of the current neuropsychological evaluation are consistent with demyelinating pattern with right greater than left deficits.  However, some of her numbness and other physical changes could be related to identified issues in cervical brain region rather than cortical brain regions.  Again, neuropsychological strengths and weaknesses are consistent with deficits related to demyelinating conditions such as MS although the patient does have a history of significant migrainous activity as well.  As far as treatment recommendations, the patient is having reactive depressive symptoms due to are changing and declining cognitive and motor functioning.  The patient is having significant visual changes, motor changes and cognitive changes which have placed a great burden  on her with regard to her ability to maintain gainful employment and maintain her previous level of life functioning.  I do think that the patient would benefit from psychotherapeutic interventions around better management and coping with her current medical status.  Obviously, neurological and medical interventions for her likely relapsing remitting multiple sclerosis diagnosis is also warranted and  it is possible that we will see an improvement in her cognitive and physical functioning over time if the patient is able to be returned to a remission status with her MS.  The patient also would likely benefit from working specifically around her sustained sleep interruption and sleep deprivation.  Addressing some of these issues can also be part of the therapeutic interventions.  The patient has already been started on an SSRI (sertraline) by Dr. Epimenio Foot which is an appropriate strategy around her depressive symptomatology and she is also working on pharmacological interventions around her recurrent migrainous activity.  I will sit down with the patient and go over the results of the current neuropsychological evaluation and we will go into greater depth as far as treatment recommendations going forward.  I will address neuropsychological/therapeutic interventions along with her ongoing neurological care during the feedback session.  Diagnosis:    Relapsing remitting multiple sclerosis (HCC)  Disturbed cognition  Current moderate episode of major depressive disorder without prior episode [facility])  Memory loss due to medical condition   _____________________ Arley Phenix, Psy.D. Clinical Neuropsychologist

## 2021-04-24 ENCOUNTER — Encounter (HOSPITAL_BASED_OUTPATIENT_CLINIC_OR_DEPARTMENT_OTHER): Payer: Managed Care, Other (non HMO) | Admitting: Psychology

## 2021-04-24 ENCOUNTER — Ambulatory Visit: Payer: Managed Care, Other (non HMO) | Admitting: Psychology

## 2021-04-24 ENCOUNTER — Other Ambulatory Visit: Payer: Self-pay

## 2021-04-24 DIAGNOSIS — F321 Major depressive disorder, single episode, moderate: Secondary | ICD-10-CM | POA: Diagnosis not present

## 2021-04-24 DIAGNOSIS — G35 Multiple sclerosis: Secondary | ICD-10-CM

## 2021-04-24 DIAGNOSIS — R413 Other amnesia: Secondary | ICD-10-CM

## 2021-04-24 DIAGNOSIS — R4189 Other symptoms and signs involving cognitive functions and awareness: Secondary | ICD-10-CM | POA: Diagnosis not present

## 2021-04-29 ENCOUNTER — Ambulatory Visit: Payer: Managed Care, Other (non HMO) | Admitting: Psychology

## 2021-06-01 ENCOUNTER — Encounter: Payer: Self-pay | Admitting: Psychology

## 2021-06-01 NOTE — Progress Notes (Signed)
04/24/2021: 3 PM-4 PM  Today's visit was an in person visit was conducted in my outpatient clinic office with the patient myself present.  We reviewed the results of the recent neuropsychological evaluation as well as recommendations.  I have included the summary and impressions/recommendations portion from the formal neuropsychological evaluation below for convenience.  The entire report can be found in the patient's EMR dated 04/15/2021.  The patient has continued to have subjective reports of memory difficulties and changes in executive functioning, attention, word finding and feeling as if she has "brain fog."  The patient's objective findings were consistent with these types of symptoms overall.   Summary of Results:                        Overall, the results of the objective neuropsychological assessment assessing a broad range of various cognitive domains pertinent to the patient's reports of changes in memory and cognitive functioning suggest a mild decrease in overall global cognitive functioning.  It is estimated that the patient was performing in the high average range relative to a normative population in her current full-scale IQ and general abilities index score were in the low average range suggesting 1 or more cognitive domains have experienced a significant decline in functioning.  Generally speaking right hemispheric function showed greater impairments than left hemispheric functions consistent with her descriptions of physical changes including more left-sided numbness and changes primarily in left-sided vision.  The patient displays greater cognitive deficits for measures primarily controlled by visual spatial and visual base skills versus auditory base skills.  The patient displayed good expressive language functioning for both fluency and targeted naming as well as good retention of verbal based skills included verbal reasoning and verbal based knowledge.  The patient showed significant  decline in deficits for visual spatial and visual analysis types of skills consistent with visual constructional and visual spatial difficulties.  However, she showed well-preserved aspects of visual reasoning and problem-solving capacity.  The patient showed significant deficits related to visual scanning, visual searching and overall speed of mental operations.  There were clear objective findings of memory difficulties primarily for visual memory and learning and while her auditory memory was below predicted levels it was generally much better preserved.  There were mild decreases in auditory encoding as well as visual encoding and only mild reductions in auditory memory and learning.  Significant deficits relative to predicted levels were seen for visual memory and learning.  However, even with her visual memory deficits the patient displayed good ability to retain what was initially learned after period of delay.  This suggest that what information is actually encoded and stored is available for later recall.  Cueing and recognition formats did seem to improve functioning but this was primarily for auditory memory and learning and less so for visual memory and learning.  This pattern is consistent with greater deficits for right hemisphere functioning versus left hemisphere functioning for memory as well.   Impression/Diagnosis:                     Overall, the results of the current neuropsychological evaluation are consistent with the patient's subjective reports.  She describes increasing memory deficits as well as changes in executive functioning, brain fog, attention and concentration difficulties, word finding difficulties and difficulties pulling up factual information.  The patient describes short-term memory difficulties and difficulties with learning new information.  Objective assessment are consistent with significant reductions in  information processing speed, visual scanning and visual searching  abilities, mild deficits with regard to attention and concentration in the encoding domain, and difficulties with pulling up factual information.  However, her word finding and verbal fluency abilities appear to be well preserved.  While there was a global reduction in memory and learning the patient's greatest memory deficits had to do with problems with storage and organization of visual based information greater than verbal based information.  As far as diagnostic considerations, the results of the current neuropsychological evaluation are consistent with primary deficits in subcortical and white matter regions more than cortical regions.  Reduced information processing speed, difficulties retrieving information and measures that require integration and coordination between multiple brain regions were all noted.  There were indications of greater difficulties for right hemispheric functioning versus left hemispheric functioning.  The results of the current neuropsychological evaluation are consistent with demyelinating pattern with right greater than left deficits.  However, some of her numbness and other physical changes could be related to identified issues in cervical brain region rather than cortical brain regions.  Again, neuropsychological strengths and weaknesses are consistent with deficits related to demyelinating conditions such as MS although the patient does have a history of significant migrainous activity as well.  As far as treatment recommendations, the patient is having reactive depressive symptoms due to are changing and declining cognitive and motor functioning.  The patient is having significant visual changes, motor changes and cognitive changes which have placed a great burden on her with regard to her ability to maintain gainful employment and maintain her previous level of life functioning.  I do think that the patient would benefit from psychotherapeutic interventions around better  management and coping with her current medical status.  Obviously, neurological and medical interventions for her likely relapsing remitting multiple sclerosis diagnosis is also warranted and it is possible that we will see an improvement in her cognitive and physical functioning over time if the patient is able to be returned to a remission status with her MS.  The patient also would likely benefit from working specifically around her sustained sleep interruption and sleep deprivation.  Addressing some of these issues can also be part of the therapeutic interventions.  The patient has already been started on an SSRI (sertraline) by Dr. Epimenio Foot which is an appropriate strategy around her depressive symptomatology and she is also working on pharmacological interventions around her recurrent migrainous activity.  I will sit down with the patient and go over the results of the current neuropsychological evaluation and we will go into greater depth as far as treatment recommendations going forward.  I will address neuropsychological/therapeutic interventions along with her ongoing neurological care during the feedback session.   Diagnosis:                                Relapsing remitting multiple sclerosis (HCC)   Disturbed cognition   Current moderate episode of major depressive disorder without prior episode [facility])   Memory loss due to medical condition     _____________________ Arley Phenix, Psy.D. Clinical Neuropsychologist

## 2021-06-18 ENCOUNTER — Ambulatory Visit: Payer: Managed Care, Other (non HMO) | Admitting: Psychology

## 2021-11-10 ENCOUNTER — Telehealth: Payer: Self-pay | Admitting: Neurology

## 2021-11-10 MED ORDER — SERTRALINE HCL 50 MG PO TABS: 50.0000 mg | ORAL_TABLET | Freq: Every day | ORAL | 4 refills | Status: DC

## 2021-11-10 MED ORDER — SUMATRIPTAN SUCCINATE 100 MG PO TABS: 100.0000 mg | ORAL_TABLET | ORAL | 5 refills | Status: DC | PRN

## 2021-11-10 NOTE — Telephone Encounter (Signed)
February 9th, 2023 she had the onset of right leg numbness, noted when she woke up.     There is tingling and numbnes and when she walks she notes mild discomfort.    She feels her balance is off and she is holding on to furniture at times.  She feels she needs the bannister on stairs.   She feels it progressed some since the onset and is no better.   She has fallen a couple times.   The right leg also seems weaker starting Oct 24, 2021.         ? ? ?She has a migraine today but not at the onset of symptoms.   These occur once a month.    ? ?She notes some nausea and has trouble tolerating large meals.  (Chronic not associated with current symptoms).    ? ?She notes reduced focus and has had some difficulty at work - better than last year.     ? ?Bladder function is fine.   No visual problems.     ? ?EDSS is 2.0 (visual 1, cerebellar 1, sensory 1, cognitive 2, ambulation 1).   C/w relapse  (2 scores 1 point or more higher).   The relapse is > 2 weeks so will hold off on IV steroid ? ? ? ? ?

## 2021-11-11 ENCOUNTER — Other Ambulatory Visit: Payer: Self-pay | Admitting: Neurology

## 2021-11-11 DIAGNOSIS — G35 Multiple sclerosis: Secondary | ICD-10-CM

## 2021-11-12 ENCOUNTER — Telehealth: Payer: Self-pay | Admitting: Neurology

## 2021-11-12 NOTE — Telephone Encounter (Signed)
She feels about the same as her recent visit for the relapse.  She did fall yesterday but does not note any further weakness or clumsiness. ? ?EDSS is 2.0 (visual 0, cerebellar 1, sensory 1, bladder hesitancy 1, cognitive 2, ambulation 1) ? ?Today, she would do the skills and cognitive tests for her 1 year visit. ?

## 2022-04-04 ENCOUNTER — Other Ambulatory Visit: Payer: Self-pay | Admitting: Neurology

## 2022-04-04 MED ORDER — OZANIMOD HCL 0.92 MG PO CAPS
ORAL_CAPSULE | ORAL | 11 refills | Status: DC
Start: 2020-11-14 — End: 2024-01-26

## 2022-11-09 ENCOUNTER — Other Ambulatory Visit: Payer: Self-pay | Admitting: Neurology

## 2022-11-09 DIAGNOSIS — G35 Multiple sclerosis: Secondary | ICD-10-CM

## 2022-11-12 ENCOUNTER — Other Ambulatory Visit: Payer: Self-pay | Admitting: Neurology

## 2022-11-12 NOTE — Telephone Encounter (Signed)
Last seen on 09/30/20 No follow up scheduled, it appears patient was in the Enlighten research program based on telephone encounter 11/13/22.  Received refill request from pharmacy for sertraline 50 mg tablet.  I wasn't sure if patient was still in research program and okay to approve? Or if you wanted patient to have a follow up visit with you.   Rx is pending

## 2022-11-19 ENCOUNTER — Telehealth: Payer: Self-pay | Admitting: Neurology

## 2022-11-19 NOTE — Telephone Encounter (Signed)
Ms. Rolfe was in the office today for a research visit (2-year) for the enlighten study.  She is on Zeposia.  She is tolerating well and has no new neurologic issues.  She actually feels she is walking back to baseline and has no difficulty keeping up with others.  She notes cognition is doing better though it still affects her at work on some days.  EDSS is 2.0 (1 point cerebellar, 2 points cerebral)  She will do the cognitive testing for the 2-year visit.

## 2022-12-07 ENCOUNTER — Encounter: Payer: Self-pay | Admitting: Neurology

## 2023-01-21 ENCOUNTER — Telehealth: Payer: Self-pay | Admitting: Neurology

## 2023-01-21 NOTE — Telephone Encounter (Signed)
She called [place] to let us know that she was having some pain behind the eyes, right greater than left.  She felt this was worse with looking at the computer.  When looking at 1 eye versus the other, she did not think that there was any blurry vision or loss of visual acuity or loss of color vision.  She thought she may have had a little bit of double vision for a few seconds but nothing sustained.  This started yesterday.  She decided to take the day off work because she did not want to look at the computer.  Today, she feels she is probably a little bit better but her symptoms have not resolved.  Over the phone we did an "exam".  Visual acuity felt normal to her out of either eye.  She had no diplopia on any gaze or with convergence.  She continues to note some pressure sensation behind the eye.  I do not think that this is an exacerbation.  I left her know that if symptoms worsen that she should give [place] a call.  Most of the time, visual changes occur shortly after pain when optic neuritis is ongoing.  However, sometimes visual changes can take a little bit longer to develop.  Therefore, she needs to call us back if she feels vision is worsening.    If she does have symptoms consistent with optic neuritis we would need to consider steroids (either 3 days IV Solu-Medrol or 3 days of high-dose oral steroid) and I would also consider a change in therapy.

## 2023-02-15 ENCOUNTER — Telehealth: Payer: Self-pay | Admitting: Neurology

## 2023-02-15 NOTE — Telephone Encounter (Signed)
She is here today for the Enlighten study (visit 10)  She reports her MS is stable and she feels she is tolerating the study drug well.     She reports her PCP checked a CBC/Diff x2 and they showed mildly reduced WBC (3.4) and mildly reduced lymphocyte count (0.7).  Neutrophils were normal at 2.0.  We discussed that Ozanimod's mechanism of action reduces the peripheral lymphocyte count by 50-70% so readings of 0.7 are very common.  Also because, at baseline, lymphocytes comprise 25-40% of WBC, the WBC count can often be mildly reduced.   I would not be concerned unless lymphocytes are 0.2 or lower or WBC is < 3.0

## 2023-04-16 DIAGNOSIS — Z6830 Body mass index (BMI) 30.0-30.9, adult: Secondary | ICD-10-CM | POA: Diagnosis not present

## 2023-04-16 DIAGNOSIS — E669 Obesity, unspecified: Secondary | ICD-10-CM | POA: Diagnosis not present

## 2023-05-17 ENCOUNTER — Telehealth: Payer: Self-pay | Admitting: Neurology

## 2023-05-17 MED ORDER — METHYLPREDNISOLONE 4 MG PO TABS: ORAL_TABLET | ORAL | 0 refills | Status: DC

## 2023-05-17 NOTE — Telephone Encounter (Signed)
The patient was here today for a visit for the enlighten study (ozanimod).  She denies any difficulties with her medication or any new neurologic symptoms.  However, she has been experiencing more pain at the base of the right thumb.  She denies fevers or warmth over the joint.  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  On examination, she is tender over the C-MC joint at the base of the right thumb but not over other joints..  Symptoms are most likely due to overuse or inflammation of that joint.  I will send in a steroid pack.  She will take Naprosyn 220 mg twice a day.  If symptoms worsen she may need to see orthopedics for further evaluation and treatment

## 2023-06-01 DIAGNOSIS — F419 Anxiety disorder, unspecified: Secondary | ICD-10-CM | POA: Diagnosis not present

## 2023-06-10 DIAGNOSIS — F419 Anxiety disorder, unspecified: Secondary | ICD-10-CM | POA: Diagnosis not present

## 2023-06-23 DIAGNOSIS — F419 Anxiety disorder, unspecified: Secondary | ICD-10-CM | POA: Diagnosis not present

## 2023-07-07 DIAGNOSIS — F419 Anxiety disorder, unspecified: Secondary | ICD-10-CM | POA: Diagnosis not present

## 2023-09-21 ENCOUNTER — Telehealth: Payer: Self-pay | Admitting: *Deleted

## 2023-09-21 NOTE — Telephone Encounter (Signed)
 Called pt. Her drug study for Zeposia  will be ending 10/2023.  Scheduled an appointment to see Dr. Vear 09/28/23 at 1:30pm. Advised her to bring updated insurance cards and medication list to appt. She will have to sign start form and get med approved via insurance now since drug study will be ending.

## 2023-09-22 ENCOUNTER — Other Ambulatory Visit: Payer: Self-pay | Admitting: Neurology

## 2023-09-22 DIAGNOSIS — G35 Multiple sclerosis: Secondary | ICD-10-CM

## 2023-09-28 ENCOUNTER — Encounter: Payer: Self-pay | Admitting: Neurology

## 2023-09-28 ENCOUNTER — Ambulatory Visit: Payer: Managed Care, Other (non HMO) | Admitting: Neurology

## 2023-09-28 ENCOUNTER — Telehealth: Payer: Self-pay | Admitting: *Deleted

## 2023-09-28 NOTE — Telephone Encounter (Signed)
Pt showed up late to appt today. I called pt and r/s to 10/05/23 at 10am w/ Dr. Epimenio Foot.

## 2023-10-05 ENCOUNTER — Telehealth: Payer: Self-pay | Admitting: *Deleted

## 2023-10-05 ENCOUNTER — Ambulatory Visit: Payer: BC Managed Care – PPO | Admitting: Neurology

## 2023-10-05 ENCOUNTER — Encounter: Payer: Self-pay | Admitting: Neurology

## 2023-10-05 VITALS — BP 127/84 | HR 77 | Ht 68.0 in | Wt 194.0 lb

## 2023-10-05 DIAGNOSIS — Z79899 Other long term (current) drug therapy: Secondary | ICD-10-CM | POA: Diagnosis not present

## 2023-10-05 DIAGNOSIS — G35 Multiple sclerosis: Secondary | ICD-10-CM | POA: Diagnosis not present

## 2023-10-05 DIAGNOSIS — H532 Diplopia: Secondary | ICD-10-CM

## 2023-10-05 DIAGNOSIS — R4189 Other symptoms and signs involving cognitive functions and awareness: Secondary | ICD-10-CM | POA: Diagnosis not present

## 2023-10-05 MED ORDER — SUMATRIPTAN SUCCINATE 100 MG PO TABS: 100.0000 mg | ORAL_TABLET | ORAL | 5 refills | Status: DC | PRN

## 2023-10-05 MED ORDER — ONDANSETRON HCL 4 MG PO TABS: ORAL_TABLET | ORAL | 0 refills | Status: AC

## 2023-10-05 NOTE — Progress Notes (Signed)
[facility]  PATIENT: Michele Krause DOB: [birth year]  REFERRING DOCTOR OR PCP: Mitzi Hansen, MD SOURCE: Patient, notes from primary care, imaging laboratory reports, MRI images personally reviewed.  _________________________________   HISTORICAL  CHIEF COMPLAINT:  Chief Complaint  Patient presents with   Room 10    Pt is here Alone. Pt states that everything has been going good. Pt states that she had a brown like lenses over her left eye. 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  She has been in the enlighten study (phase 3B for Zeposia) but that study has come to an end.  She needs to be switched over to commercial Zeposia.  She will still have 1 more safety visit and the MRI as part of the study  She was diagnosed with MS July 2021 but had symptoms on several occasion over the last 3 years.  She had the onset of left sided numbness in early June 2021 from shoulder to leg that she notes if she sits a while or stands a while.  In April 2021, she noted headache and reduced left sided vision.   She also has had intermittent diplopia since that time.  Gait is stable. She can easily walk 3 miles and goes to the gym 3 times a week.    She keeps up with everyone with activity.  Balance on one foot is slightly off and she sometimes uses the bannister on stairs.    She denies any weakness.  There is no fixed numbness.  She rarely has rare diplopia if tired - only for a minute or so.   She had one episide of blurrly OS vision but only for 30-60 minutes.  She is noting more difficulty with her focus and attention. Sometimes she has trouble coming up with the right words.  Memory is ok.    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  Mood is currently doing well.  She is no longer on sertraline.  She is on birth  control currently.    She is not planning for a pregnancy in the next couple years.     She gets occasional migraine headaches.  These are not as common as they had been in the past.  She is no longer on zonisamide and Ajovy shots.    She takes Vit D supplements   MS History Michele Krause had difficulty with frequent migraine headaches that became daily around March 2021.  She had an MRI 03/09/2020 showing changes c/w MS and was referred to me.   On further review, she has had some neurologic symptoms.    She had the onset of left sided numbness in early June 2021 from shoulder to leg that she notes if she sits a while or stands a while.  In April 2021, she noted headache and reduced left sided vision (and had reduced color visoin OS on exam 03/2020).   She also notes her balance has been poor since she was younger.   Since 2019, though, she notes a feeling like she will topple over if she stands on one spot.     I personally reviewed the MRI of the brain dated 03/09/2020.  It shows multiple T2/FLAIR hyperintense foci.  2 are located in the right middle cerebellar peduncle and adjacent cerebellum  others are located in the hemispheres, some in the periventricular white matter and some in the deep white matter.  Sagittal images were not performed.  Contrast was not performed but two foci in the periventricular white matter were mildly bright on diffusion-weighted images.   The MRI of the cervical spine MRI 04/21/2020 showed 3 small T2 hyperintense foci within the spinal cord, adjacent to C3, C4-C5 and C6-C7 as detailed above.  None of these enhance or appear to be acute.  These are consistent with chronic demyelinating plaque associated with multiple sclerosis.     MRI brain 04/21/2020 showed multiple T2/FLAIR hyperintensity involving right cerebellum, subtle brainstem, periventricular and pericallosal regions with suggestive of demyelinating disease.  No enhancing lesions are noted.  It was unchanged  compared to the 03/09/2020 MRI  She has a second cousin with MS diagnosed in 2002 diagnosed in early 30's.    Headache history: She is currently on zonisamide and has had trigger point injections for her headaches.  The higher dose of zonisamide has helped the headache more.  She gets nausea, photophobia and phonophobia.   She takes sumatriptan or baclofen when a headache occurs.   This only helps the stabbing pain but does not make the headache go away.    For chronic migraines she has tried and failed anti-epileptic (zonisamide), muscle relaxant (baclofen) and triptans (sumatriptrans).  I gave her a prescription for Ajovy that she has filled.  She has not yet started.   REVIEW OF SYSTEMS: Constitutional: No fevers, chills, sweats, or change in appetite Eyes: No visual changes, double vision, eye pain Ear, nose and throat: No hearing loss, ear pain, nasal congestion, sore throat Cardiovascular: No chest pain, palpitations Respiratory:  No shortness of breath at rest or with exertion.   No wheezes GastrointestinaI: No nausea, vomiting, diarrhea, abdominal pain, fecal incontinence Genitourinary:  No dysuria, urinary retention or frequency.  No nocturia. Musculoskeletal:  No neck pain, back pain Integumentary: No rash, pruritus, skin lesions Neurological: as above Psychiatric: No depression at this time.  No anxiety Endocrine: No palpitations, diaphoresis, change in appetite, change in weigh or increased thirst Hematologic/Lymphatic:  No anemia, purpura, petechiae. Allergic/Immunologic: No itchy/runny eyes, nasal congestion, recent allergic reactions, rashes  ALLERGIES: No Known Allergies  HOME MEDICATIONS:  Current Outpatient Medications:    baclofen (LIORESAL) 10 MG tablet, Take 1 tablet by mouth every 30 (thirty) days., Disp: , Rfl:    ibuprofen (ADVIL,MOTRIN) 200 MG tablet, Take 400 mg by mouth every 6 (six) hours as needed for headache., Disp: , Rfl:    LINZESS 145 MCG CAPS capsule,  Take 145 mcg by mouth daily., Disp: , Rfl:    OVER THE COUNTER MEDICATION, Take 1 capsule by mouth daily. Herbal Life Cell Activator, Disp: , Rfl:    Ozanimod HCl 0.92 MG CAPS, One po qd, Disp: 30 capsule, Rfl: 11   ondansetron (ZOFRAN) 4 MG tablet, Take 1 tablet by mouth three times daily as needed, Disp: 30 tablet, Rfl: 0   SUMAtriptan (IMITREX) 100 MG tablet, Take 1 tablet (100 mg total) by mouth as needed., Disp: 10 tablet, Rfl: 5  PAST MEDICAL HISTORY: Past Medical History:  Diagnosis Date   Migraine    Obesity     PAST SURGICAL HISTORY: Past Surgical History:  Procedure Laterality Date   NO PAST SURGERIES      FAMILY HISTORY: Family History  Problem Relation Age of Onset   Multiple sclerosis Cousin     SOCIAL HISTORY:  Social History  Socioeconomic History   Marital status: Single    Spouse name: Not on file   Number of children: 0   Years of education: BA   Highest education level: Not on file  Occupational History   Occupation: on [employer]  Tobacco Use   Smoking status: Never   Smokeless tobacco: Never  Substance and Sexual Activity   Alcohol use: Yes    Comment: socially- 2 glasses per week or less   Drug use: No   Sexual activity: Not on file  Other Topics Concern   Not on file  Social History Narrative   Right handed    Caffeine use: none   Social Drivers of Corporate investment banker Strain: Not on file  Food Insecurity: Not on file  Transportation Needs: Not on file  Physical Activity: Not on file  Stress: Not on file  Social Connections: Unknown (01/19/2022)   Received from [facility]   Social Network    Social Network: Not on file  Intimate Partner Violence: Unknown (12/11/2021)   Received from [facility], [facility]   HITS    Physically Hurt: Not on file    Insult or Talk Down To: Not on file    Threaten Physical Harm: Not on file    Scream or Curse: Not on file     PHYSICAL EXAM  Vitals:   10/05/23 1009   BP: 127/84  Pulse: 77  Weight: 194 lb (88 kg)  Height: 5\' 8"  (1.727 m)    Body mass index is 29.5 kg/m.    General: The patient is well-developed and well-nourished and in no acute distress  Neurologic Exam  Mental status: The patient is alert and oriented x 3 at the time of the examination. The patient has apparent normal recent and remote memory, with an mildly reduced attention span and concentration ability.   Speech is normal but a few pauses coming up with words..  Cranial nerves: Extraocular movements are full.  Color vision was symmetric.  Facial symmetry is present. There is good facial sensation to soft touch bilaterally.Facial strength is normal.  Trapezius and sternocleidomastoid strength is normal. No dysarthria is noted.  No obvious hearing deficits are noted.  Motor:  Muscle bulk is normal.   Tone is normal. Strength is  5 / 5 in all 4 extremities.   Sensory: Sensory testing is intact to pinprick, soft touch and vibration sensation in all 4 extremities.  Coordination: Cerebellar testing reveals good finger-nose-finger and heel-to-shin bilaterally.  Gait and station: Station is normal.   Gait is normal. Tandem gait is very slightly wide. Romberg is negative.   Reflexes: Deep tendon reflexes are symmetric and normal bilaterally.   Plantar responses are flexor.  EDSS: 1.5 (1 point cognitive and 1 point cerebellar)     ASSESSMENT AND PLAN  Relapsing remitting multiple sclerosis (HCC)  Disturbed cognition  Diplopia  High risk medication use  1.   Zeposia 0.92 every day.  She signed a service request form and we will try to get her started before she completes the study 2.  Sumatriptan and ondansetron prn migraine.   If these worsen again we can get back on zonisamide/Ajovy or other medication 3.   Stay active and exercise as tolerated. Return in 6 months or sooner if there are new or worsening neurologic symptoms.    Michele Krause A. Epimenio Foot, MD, [facility]  10/05/2023, 1:00 PM Certified in Neurology, Clinical Neurophysiology, Sleep Medicine and Neuroimaging  [facility]  [address], [address] [place], [place] [address] [phone]

## 2023-10-05 NOTE — Telephone Encounter (Signed)
Faxed completed/signed Zeposia start form to 360 support at [phone]. Received fax confirmation.

## 2023-10-07 NOTE — Telephone Encounter (Signed)
Attempted PA for Zeposia via CMM. Sent to [employer]. Key: [id number]. Received note that PA is not needed.

## 2023-10-11 NOTE — Telephone Encounter (Signed)
Joe from [facility] called stating a PA for Michele Krause is needed. Phone #: [phone].  KEY #: [id number]  Please start PA.

## 2023-10-14 NOTE — Telephone Encounter (Signed)
 I am working on the Zeposia PA. Key: [id number].

## 2023-10-14 NOTE — Telephone Encounter (Signed)
 Jenette Mitchell from Zep[employer]ia has asked to confirm the Key Code provided on 10-11-23. She asked it be noted that the PA needs to be submitted to the plan by May 4th or the patient will not qualify for the free drug program, their call back # is [phone]

## 2023-10-19 NOTE — Telephone Encounter (Signed)
Completed PA for Zeposia. Sent to [facility] via CMM. Key: [id number]. Should have a determination within 3-5 business days.

## 2023-10-20 NOTE — Telephone Encounter (Signed)
I called [employer]. Patient's coverage with them has been terminated effective 10/08/2023.  I called patient. She left her job and is uninsured but she should start a new job within the next couple weeks. She already has received a month of Zeposia and is tolerating it well. She will send [place] a copy of her insurance cards when she gets it.  I called [id number]. If she does not have commercial insurance she no longer qualifies for the bridge program.To apply for assistance in the interim, she can call BMSPAF at [phone].

## 2023-10-20 NOTE — Telephone Encounter (Signed)
[name] Kitchen

## 2023-10-23 ENCOUNTER — Telehealth: Payer: Self-pay | Admitting: Neurology

## 2023-10-23 NOTE — Telephone Encounter (Signed)
MRI of the brain 10/12/2023 ([facility]) shows T2/FLAIR hyperintense foci in the hemispheres, pons and right middle cerebral peduncle in a pattern consistent with chronic demyelination associated with MS.  No new lesions compared to the previous MRI from 11/24/2022

## 2023-10-26 ENCOUNTER — Ambulatory Visit: Payer: Managed Care, Other (non HMO) | Admitting: Dermatology

## 2023-11-01 NOTE — Telephone Encounter (Signed)
 Faxed completed/signed Zeposia PAF form to [phone]. Marked urgent, received fax confirmation.

## 2023-11-03 NOTE — Telephone Encounter (Signed)
 [name] Kitchen

## 2023-12-31 ENCOUNTER — Other Ambulatory Visit (HOSPITAL_COMMUNITY): Payer: Self-pay

## 2024-01-03 NOTE — Telephone Encounter (Signed)
 [name]) checking on status of Prior Authorization. Received on the portal Prior Authorization is not required, but patient's new insurance requires a prior authorization. Would like a call back to [phone]

## 2024-01-03 NOTE — Telephone Encounter (Signed)
 Starling Eck, have you worked on [employer] via new insurance?

## 2024-01-05 ENCOUNTER — Other Ambulatory Visit (HOSPITAL_COMMUNITY): Payer: Self-pay

## 2024-01-05 ENCOUNTER — Telehealth: Payer: Self-pay

## 2024-01-05 NOTE — Telephone Encounter (Signed)
 I called [employer]ep[employer] 360. This was a 20 minute phone call. Spoke with Zoila Hines. Patient may have gotten new insurance through [employer] [place] [employer]? We do not have this information.  If the patient has new insurance, she may need to have a new PA on file with that insurance to continue her Zeposia. If she has new coverage, she is unlikely to be continued with the BMS PAF.  Of note, patient is already taking Zeposia and is tolerating it well. This may be an appropriate PA for the PA team to complete.  [employer] [place]   ID: [id number] Bin: [id number] PCN: [id number] [id number]

## 2024-01-05 NOTE — Telephone Encounter (Signed)
 Pharmacy Patient Advocate Encounter   Received notification from Physician's Office that prior authorization for Zeposia is required/requested.   Insurance verification completed.   The patient is insured through [employer] [place] / [employer]  .   Per test claim: PA required; PA submitted to above mentioned insurance via [email or web address] Key/confirmation #/EOC N/A Status is pending  [email or web address]  [employer] and they referred me to submit PA via [employer] website-did not give me an EOC.

## 2024-01-05 NOTE — Telephone Encounter (Signed)
 Received call from Joe from [employer] that per patient insurance plan PA is needed and PA can be done with plan please call [phone].

## 2024-01-11 NOTE — Telephone Encounter (Signed)
 Pharmacy Patient Advocate Encounter  Received notification from [employer] [place] /[employer]  that Prior Authorization for  Zeposia has been DENIED.  Full denial letter will be uploaded to the media tab. See denial reason below.  All of the following rules need to be met AND documented in the patient's chart for approval:  The prescriber has performed an electrocardiogram within 6 months prior to initiating treatment.  PA #/Case ID/Reference #: [id number]

## 2024-01-13 ENCOUNTER — Telehealth: Payer: Self-pay | Admitting: Pharmacist

## 2024-01-13 NOTE — Telephone Encounter (Signed)
 An urgent appeal has been submitted for Zeposia. Will advise when response is received, please be advised that most companies may take 30 days to make a decision. Appeal letter and supporting documentation have been faxed to [phone] on 01/13/2024 @12 :44 pm.  Thank you, Dene Fines, PharmD Clinical Pharmacist  [place]  Direct Dial: [phone]

## 2024-01-13 NOTE — Telephone Encounter (Signed)
 Pt was in Enlighten drug study. Had ECG 10/10/20. See below. Started on medication 02/13/21 through drug study. Can you urgently appeal through insurance? Thank you!

## 2024-01-17 ENCOUNTER — Encounter: Payer: Self-pay | Admitting: Neurology

## 2024-01-17 ENCOUNTER — Telehealth: Payer: Self-pay | Admitting: Pharmacist

## 2024-01-17 NOTE — Telephone Encounter (Signed)
 Sent mychart to pt.

## 2024-01-17 NOTE — Telephone Encounter (Signed)
 The appeal for Zeposia has been approved by the insurance:    Thank you, Dene Fines, PharmD Clinical Pharmacist  [place]  Direct Dial: [phone]

## 2024-01-26 ENCOUNTER — Telehealth: Payer: Self-pay | Admitting: Neurology

## 2024-01-26 DIAGNOSIS — G35 Multiple sclerosis: Secondary | ICD-10-CM

## 2024-01-26 MED ORDER — ZEPOSIA 0.92 MG PO CAPS: 1.0000 | ORAL_CAPSULE | Freq: Every day | ORAL | 8 refills | Status: AC

## 2024-01-26 NOTE — Telephone Encounter (Signed)
 Called back and spoke w/ tech, [employer]. They are needing rx Zeposia e-scribed to them. I e-scribed as requested. Provided them appeal approval dates. He verbalized understanding, nothing further needed.

## 2024-01-26 NOTE — Telephone Encounter (Signed)
 [facility] is asking for a call back from RN re: a Rx for Zeposia, her call back # is [phone]

## 2024-04-19 ENCOUNTER — Encounter: Payer: Self-pay | Admitting: Neurology

## 2024-04-19 ENCOUNTER — Ambulatory Visit (INDEPENDENT_AMBULATORY_CARE_PROVIDER_SITE_OTHER): Payer: BC Managed Care – PPO | Admitting: Neurology

## 2024-04-19 VITALS — BP 137/89 | HR 76 | Ht 67.0 in | Wt 196.0 lb

## 2024-04-19 DIAGNOSIS — Z79899 Other long term (current) drug therapy: Secondary | ICD-10-CM | POA: Diagnosis not present

## 2024-04-19 DIAGNOSIS — G35 Multiple sclerosis: Secondary | ICD-10-CM | POA: Diagnosis not present

## 2024-04-19 DIAGNOSIS — H532 Diplopia: Secondary | ICD-10-CM | POA: Diagnosis not present

## 2024-04-19 MED ORDER — ETODOLAC 400 MG PO TABS: 400.0000 mg | ORAL_TABLET | Freq: Two times a day (BID) | ORAL | 5 refills | Status: DC

## 2024-04-19 MED ORDER — METHYLPREDNISOLONE 4 MG PO TABS: ORAL_TABLET | ORAL | 0 refills | Status: AC

## 2024-04-19 NOTE — Progress Notes (Signed)
 [facility]  PATIENT: Michele Krause DOB: [birth year]  REFERRING DOCTOR OR PCP: Romero Sieving, MD SOURCE: Patient, notes from primary care, imaging laboratory reports, MRI images personally reviewed.  _________________________________   HISTORICAL  CHIEF COMPLAINT:  Chief Complaint  Patient presents with   Follow-up    Pt in room 11. Alone. Here for MS follow up on Zeposia . Patient reports bottom of feet are sore when walking barefoot for last 2 months. Otherwise doing well, last eye exam was last year.     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  Blood work checked at end of [facility] study was fine.    She is noting pain in the left>>right bottom the foot pain, worse first thing in am or if seated a longer time.  Worse if walks a long distance or on a hard surface.   Pain is not furhter up.    She was diagnosed with MS July 2021 but had symptoms on several occasion over the last 3 years.  She had the onset of left sided numbness in early June 2021 from shoulder to leg that she notes if she sits a while or stands a while.  In April 2021, she noted headache and reduced left sided vision.   She also has had intermittent diplopia since that time.  MRI of the brain 10/12/2023 ([facility]) shows T2/FLAIR hyperintense foci in the hemispheres, pons and right middle cerebral peduncle in a pattern consistent with chronic demyelination associated with MS.  No new lesions compared to the previous MRI from 11/24/2022  Gait is stable. She can easily walk 3 miles and goes to the gym 3 times a week.    She keeps up with everyone with activity.  Balance on one foot is slightly off and she sometimes uses the bannister on stairs.    She denies any weakness.  There is no fixed numbness.  She rarely has rare diplopia if tired - only for a minute or so.   She had one episide of blurrly  OS vision but only for 30-60 minutes.  She sometimes has difficulty with her focus and attention. Sometimes she has trouble coming up with the right words.  Memory is ok.     Mood is currently doing well.  She is no longer on sertraline .  She sleeps well in general   She is on birth control currently.    She is not planning for a pregnancy in the next couple years.     She gets only rare migraine headaches.  These are not as common as they had been in the past. Excedrin migraine helps (just needs once a month on average).   Sometimes menstrually realted.  She is no longer on zonisamide and Ajovy  shots.    She takes Vit D supplements   MS History [name] had difficulty with frequent migraine headaches that became daily around March 2021.  She had an MRI 03/09/2020 showing changes c/w MS and was referred to me.   On further review, she has had some neurologic symptoms.    She had the onset of left sided numbness in early June 2021 from shoulder to leg that she notes if she sits a while or stands a while.  In April 2021, she noted headache and reduced left sided vision (and had reduced color visoin OS on exam 03/2020).  She also notes her balance has been poor since she was younger.   Since 2019, though, she notes a feeling like she will topple over if she stands on one spot.     I personally reviewed the MRI of the brain dated 03/09/2020.  It shows multiple T2/FLAIR hyperintense foci.  2 are located in the right middle cerebellar peduncle and adjacent cerebellum others are located in the hemispheres, some in the periventricular white matter and some in the deep white matter.  Sagittal images were not performed.  Contrast was not performed but two foci in the periventricular white matter were mildly bright on diffusion-weighted images.   The MRI of the cervical spine MRI 04/21/2020 showed 3 small T2 hyperintense foci within the spinal cord, adjacent to C3, C4-C5 and C6-C7 as detailed above.  None of  these enhance or appear to be acute.  These are consistent with chronic demyelinating plaque associated with multiple sclerosis.     MRI brain 04/21/2020 showed multiple T2/FLAIR hyperintensity involving right cerebellum, subtle brainstem, periventricular and pericallosal regions with suggestive of demyelinating disease.  No enhancing lesions are noted.  It was unchanged compared to the 03/09/2020 MRI  She has a second cousin with MS diagnosed in 2002 diagnosed in early 30's.    Headache history: She is currently on zonisamide and has had trigger point injections for her headaches.  The higher dose of zonisamide has helped the headache more.  She gets nausea, photophobia and phonophobia.   She takes sumatriptan  or baclofen when a headache occurs.   This only helps the stabbing pain but does not make the headache go away.    For chronic migraines she has tried and failed anti-epileptic (zonisamide), muscle relaxant (baclofen) and triptans (sumatriptrans).  I gave her a prescription for Ajovy  that she has filled.  She has not yet started.   REVIEW OF SYSTEMS: Constitutional: No fevers, chills, sweats, or change in appetite Eyes: No visual changes, double vision, eye pain Ear, nose and throat: No hearing loss, ear pain, nasal congestion, sore throat Cardiovascular: No chest pain, palpitations Respiratory:  No shortness of breath at rest or with exertion.   No wheezes GastrointestinaI: No nausea, vomiting, diarrhea, abdominal pain, fecal incontinence Genitourinary:  No dysuria, urinary retention or frequency.  No nocturia. Musculoskeletal:  No neck pain, back pain Integumentary: No rash, pruritus, skin lesions Neurological: as above Psychiatric: No depression at this time.  No anxiety Endocrine: No palpitations, diaphoresis, change in appetite, change in weigh or increased thirst Hematologic/Lymphatic:  No anemia, purpura, petechiae. Allergic/Immunologic: No itchy/runny eyes, nasal congestion,  recent allergic reactions, rashes  ALLERGIES: No Known Allergies  HOME MEDICATIONS:  Current Outpatient Medications:    etodolac  (LODINE ) 400 MG tablet, Take 1 tablet (400 mg total) by mouth 2 (two) times daily., Disp: 60 tablet, Rfl: 5   ibuprofen (ADVIL,MOTRIN) 200 MG tablet, Take 400 mg by mouth every 6 (six) hours as needed for headache., Disp: , Rfl:    LINZESS 145 MCG CAPS capsule, Take 145 mcg by mouth daily., Disp: , Rfl:    methylPREDNISolone  (MEDROL ) 4 MG tablet, Taper from 6 pills po for one day to 1 pill po the last day over 6 days, Disp: 21 tablet, Rfl: 0   OVER THE COUNTER MEDICATION, Take 1 capsule by mouth daily. Herbal Life Cell Activator, Disp: , Rfl:    SUMAtriptan  (IMITREX ) 100 MG tablet, Take 1 tablet (100 mg total) by mouth as needed., Disp: 10 tablet, Rfl: 5  ZEPOSIA  0.92 MG CAPS, Take 1 capsule (0.92 mg total) by mouth daily., Disp: 30 capsule, Rfl: 8   baclofen (LIORESAL) 10 MG tablet, Take 1 tablet by mouth every 30 (thirty) days. (Patient not taking: Reported on 04/19/2024), Disp: , Rfl:    ondansetron  (ZOFRAN ) 4 MG tablet, Take 1 tablet by mouth three times daily as needed (Patient not taking: Reported on 04/19/2024), Disp: 30 tablet, Rfl: 0  PAST MEDICAL HISTORY: Past Medical History:  Diagnosis Date   Migraine    Obesity     PAST SURGICAL HISTORY: Past Surgical History:  Procedure Laterality Date   NO PAST SURGERIES      FAMILY HISTORY: Family History  Problem Relation Age of Onset   Multiple sclerosis Cousin     SOCIAL HISTORY:  Social History   Socioeconomic History   Marital status: Single    Spouse name: Not on file   Number of children: 0   Years of education: BA   Highest education level: Not on file  Occupational History   Occupation: on [employer]  Tobacco Use   Smoking status: Never   Smokeless tobacco: Never  Vaping Use   Vaping status: Never Used  Substance and Sexual Activity   Alcohol use: Yes    Comment: socially- 2 glasses  per week or less   Drug use: Yes    Types: Marijuana    Comment: social   Sexual activity: Not on file  Other Topics Concern   Not on file  Social History Narrative   Right handed    Caffeine use: none   Social Drivers of Corporate investment banker Strain: Not on file  Food Insecurity: Not on file  Transportation Needs: Not on file  Physical Activity: Not on file  Stress: Not on file  Social Connections: Unknown (01/19/2022)   [facility]   Social Network    Social Network: Not on file  Intimate Partner Violence: Unknown (12/11/2021)   Received from [facility]   HITS    Physically Hurt: Not on file    Insult or Talk Down To: Not on file    Threaten Physical Harm: Not on file    Scream or Curse: Not on file     PHYSICAL EXAM  Vitals:   04/19/24 1552  BP: 137/89  Pulse: 76  Weight: 196 lb (88.9 kg)  Height: 5' 7 (1.702 m)    Body mass index is 30.7 kg/m.    General: The patient is well-developed and well-nourished and in no acute distress  Neurologic Exam  Mental status: The patient is alert and oriented x 3 at the time of the examination. The patient has apparent normal recent and remote memory, with an mildly reduced attention span and concentration ability.   Speech is normal but a few pauses coming up with words..  Cranial nerves: Extraocular movements are full.  Color vision was symmetric.  Facial symmetry is present. There is good facial sensation to soft touch bilaterally.Facial strength is normal.  Trapezius and sternocleidomastoid strength is normal. No dysarthria is noted.  No obvious hearing deficits are noted.  Motor:  Muscle bulk is normal.   Tone is normal. Strength is  5 / 5 in all 4 extremities.   Sensory: Sensory testing is intact to pinprick, soft touch and vibration sensation in all 4 extremities.  Coordination: Cerebellar testing reveals good finger-nose-finger and heel-to-shin bilaterally.  Gait and station: Station is  normal.   Gait is normal. Tandem gait  is very slightly wide. Romberg is negative.   Reflexes: Deep tendon reflexes are symmetric and normal bilaterally.   Plantar responses are flexor.  EDSS: 1.5 (1 point cognitive and 1 point cerebellar)     ASSESSMENT AND PLAN  Relapsing remitting multiple sclerosis (HCC)  High risk medication use  Diplopia  1.   Zeposia  0.92 every day.   She is tolerating it well.  Lab work was fine several months ago so we can wait till her next visit to recheck.  Also consider checking a repeat brain MRI around the time of the next visit 2.  Sumatriptan  and ondansetron  prn migraine.   If these worsen again we can get back on zonisamide/Ajovy  or other medication 3.   Foot pain may be plantar fasciitis - will send in a medrol  dosepak and NSAID.  If not better consider referral to podiatry.   Return in 6 months or sooner if there are new or worsening neurologic symptoms.    Yemariam Magar A. Vear, MD, [facility] 04/19/2024, 4:39 PM Certified in Neurology, Clinical Neurophysiology, Sleep Medicine and Neuroimaging  [facility] [address], [address] [place], [place] [address] [phone]

## 2024-05-01 DIAGNOSIS — Z862 Personal history of diseases of the blood and blood-forming organs and certain disorders involving the immune mechanism: Secondary | ICD-10-CM | POA: Diagnosis not present

## 2024-05-01 DIAGNOSIS — Z0000 Encounter for general adult medical examination without abnormal findings: Secondary | ICD-10-CM | POA: Diagnosis not present

## 2024-05-01 DIAGNOSIS — E569 Vitamin deficiency, unspecified: Secondary | ICD-10-CM | POA: Diagnosis not present

## 2024-05-02 DIAGNOSIS — E569 Vitamin deficiency, unspecified: Secondary | ICD-10-CM | POA: Diagnosis not present

## 2024-05-02 DIAGNOSIS — Z862 Personal history of diseases of the blood and blood-forming organs and certain disorders involving the immune mechanism: Secondary | ICD-10-CM | POA: Diagnosis not present

## 2024-05-02 DIAGNOSIS — Z0000 Encounter for general adult medical examination without abnormal findings: Secondary | ICD-10-CM | POA: Diagnosis not present

## 2024-05-25 ENCOUNTER — Ambulatory Visit: Payer: Managed Care, Other (non HMO) | Admitting: Dermatology

## 2024-05-25 ENCOUNTER — Encounter: Payer: Self-pay | Admitting: Dermatology

## 2024-05-25 VITALS — BP 107/70 | HR 58

## 2024-05-25 DIAGNOSIS — L732 Hidradenitis suppurativa: Secondary | ICD-10-CM | POA: Insufficient documentation

## 2024-05-25 DIAGNOSIS — N944 Primary dysmenorrhea: Secondary | ICD-10-CM | POA: Insufficient documentation

## 2024-05-25 DIAGNOSIS — L680 Hirsutism: Secondary | ICD-10-CM

## 2024-05-25 DIAGNOSIS — L709 Acne, unspecified: Secondary | ICD-10-CM | POA: Diagnosis not present

## 2024-05-25 DIAGNOSIS — L810 Postinflammatory hyperpigmentation: Secondary | ICD-10-CM | POA: Diagnosis not present

## 2024-05-25 MED ORDER — TRETINOIN 0.025 % EX CREA: TOPICAL_CREAM | Freq: Every day | CUTANEOUS | 3 refills | Status: AC

## 2024-05-25 MED ORDER — SPIRONOLACTONE 100 MG PO TABS: 100.0000 mg | ORAL_TABLET | Freq: Every day | ORAL | 9 refills | Status: AC

## 2024-05-25 NOTE — Patient Instructions (Addendum)
 Date: Thu May 25 2024  Hello [place],  Thank you for visiting today. Here is a summary of the key instructions:  - Medications:   - Restart tretinoin  0.025% cream on Monday, Wednesday, and Friday nights   - Apply a pea-size amount after washing face and applying Eucerin Radiant Tone   - If skin gets too dry, reduce to 2 nights per week   - Start spironolactone  100mg  daily with dinner   - Watch for lightheadedness or dizziness  - Skin Care:   - Use Eucerin Radiant Tone morning and night for dark spots   - Apply sunscreen like Centella on top during the day   - Use Radiant Tone for groin area discoloration   - Use hydrocortisone for 2 days after waxing to calm irritation   - Avoid post-wax oils and lotions   - Spot treat bumps with La Roche-Posay Benzoyl Peroxide with LHA   - Be careful as it can stain clothing  - Follow-up:   - Return for follow-up appointment in 6 months  - Other Instructions:   - Use recommended facial hair removal device   - Be careful with tretinoin  if you wax, as it can rip the skin  Please reach out if you have any questions or concerns.  Warm regards,  Dr. [facility]on [facility]           Important Information   Due to recent changes in healthcare laws, you may see results of your pathology and/or laboratory studies on MyChart before the doctors have had a chance to review them. We understand that in some cases there may be results that are confusing or concerning to you. Please understand that not all results are received at the same time and often the doctors may need to interpret multiple results in order to provide you with the best plan of care or course of treatment. Therefore, we ask that you please give us  2 business days to thoroughly review all your results before contacting the office for clarification. Should we see a critical lab result, you will be contacted sooner.     If You Need Anything After Your Visit   If you have any  questions or concerns for your doctor, please call our main line at [phone]. If no one answers, please leave a voicemail as directed and we will return your call as soon as possible. Messages left after 4 pm will be answered the following business day.    You may also send us  a message via MyChart. We typically respond to MyChart messages within 1-2 business days.  For prescription refills, please ask your pharmacy to contact our office. Our fax number is [phone].  If you have an urgent issue when the clinic is closed that cannot wait until the next business day, you can page your doctor at the number below.     Please note that while we do our best to be available for urgent issues outside of office hours, we are not available 24/7.    If you have an urgent issue and are unable to reach us , you may choose to seek medical care at your doctor's office, retail clinic, urgent care center, or emergency room.   If you have a medical emergency, please immediately call 911 or go to the emergency department. In the event of inclement weather, please call our main line at [phone] for an update on the status of any delays or closures.  Dermatology Medication Tips: Please keep  the boxes that topical medications come in in order to help keep track of the instructions about where and how to use these. Pharmacies typically print the medication instructions only on the boxes and not directly on the medication tubes.   If your medication is too expensive, please contact our office at [phone] or send us  a message through MyChart.    We are unable to tell what your co-pay for medications will be in advance as this is different depending on your insurance coverage. However, we may be able to find a substitute medication at lower cost or fill out paperwork to get insurance to cover a needed medication.    If a prior authorization is required to get your medication covered by your insurance  company, please allow us  1-2 business days to complete this process.   Drug prices often vary depending on where the prescription is filled and some pharmacies may offer cheaper prices.   The website www.goodrx.com contains coupons for medications through different pharmacies. The prices here do not account for what the cost may be with help from insurance (it may be cheaper with your insurance), but the website can give you the price if you did not use any insurance.  - You can print the associated coupon and take it with your prescription to the pharmacy.  - You may also stop by our office during regular business hours and pick up a GoodRx coupon card.  - If you need your prescription sent electronically to a different pharmacy, notify our office through [facility] or by phone at [phone]

## 2024-05-25 NOTE — Progress Notes (Signed)
   New Patient Visit   Subjective  Michele Krause is a 38 y.o. female who presents for the following: Ingrown Hairs with secondary PIH  Patient states she has ingrown hairs located at the Dailey and vagina that she would like to have examined. Patient reports the areas have been there for several years. She reports the areas are not bothersome. Patient reports she has not previously been treated for these areas. She reports she has tried otc Cetaphil product and tumeric soaps to try to help lighten the dark spot. Patient reports she is not actively pregnant or trying to conceive.    The following portions of the chart were reviewed this encounter and updated as appropriate: medications, allergies, medical history  Review of Systems:  No other skin or systemic complaints except as noted in HPI or Assessment and Plan.  Objective  Well appearing patient in no apparent distress; mood and affect are within normal limits.  A focused examination was performed of the following areas: Chin and vagina  Relevant exam findings are noted in the Assessment and Plan.           Assessment & Plan   Hirsutism and Acne like ingrown Hairs Exam: unwanted hair growth located at the face and chin  - Assessment: Patient reports issues with ingrown hairs and unwanted facial hair. Previous treatment with tretinoin  was discontinued 10-15 years ago. The condition also affects the groin area. Restarting tretinoin  is indicated to promote skin cell turnover, preventing hair from becoming trapped. Spironolactone  is recommended to block hormone receptors, potentially reducing acne and unwanted hair growth.  Treatment Plan: - Prescribed Tretinoin  0.025% to Apply topically at bedtime. Apply 1 night weekly for the first month, Apply 2 nights weekly for 1 month, If your skin tolerates increase to 3 nights weekly starting at 3 months. Decrease usage if you experience excessive dryness to 1-2 nights and decrease usage  1-2 night - Recommended to apply Eucerin Radiant Tone daily to lift excess pigment - Prescribed Spironolactone  100 mg tablets to take daily - Recommended researching ND Lag Laser  - Recommended applying Hydrocortisone to the bikini area after waxing to calm down inflammation - Educated it is ok to apply Eucerin Radiant tone to [employer]  Hyperpigmentation - Assessment: Patient presents with dark spots, likely related to ingrown hairs and unwanted hair removal practices. - Plan:    Apply Eucerin Radiant Tone morning and night to lighten dark spots    Apply sunscreen over Eucerin Radiant Tone in the morning    Anticipate 4 months for visible pigment improvement HIRSUTISM   Related Medications spironolactone  (ALDACTONE ) 100 MG tablet Take 1 tablet (100 mg total) by mouth daily. ACNE, UNSPECIFIED ACNE TYPE   Related Medications tretinoin  (RETIN-A ) 0.025 % cream Apply topically at bedtime. Apply topically at bedtime. Apply 1 night weekly for the first month, Apply 2 nights weekly for 1 month, If your skin tolerates increase to 3 nights weekly starting at 3 months. Decrease usage if you experience excessive dryness to 1-2 nights. During colder months only apply 1-2 Nights  Return in about 6 months (around 11/22/2024) for Hirsutism F/U.  I, Jetta Ager, am acting as Neurosurgeon for [employer], DO.  Documentation: I have reviewed the above documentation for accuracy and completeness, and I agree with the above.  Delon Lenis, DO

## 2024-07-04 DIAGNOSIS — S96911A Strain of unspecified muscle and tendon at ankle and foot level, right foot, initial encounter: Secondary | ICD-10-CM | POA: Diagnosis not present

## 2024-07-04 DIAGNOSIS — S93401A Sprain of unspecified ligament of right ankle, initial encounter: Secondary | ICD-10-CM | POA: Diagnosis not present

## 2024-07-14 DIAGNOSIS — Z1159 Encounter for screening for other viral diseases: Secondary | ICD-10-CM | POA: Diagnosis not present

## 2024-07-14 DIAGNOSIS — N760 Acute vaginitis: Secondary | ICD-10-CM | POA: Diagnosis not present

## 2024-07-14 DIAGNOSIS — Z114 Encounter for screening for human immunodeficiency virus [HIV]: Secondary | ICD-10-CM | POA: Diagnosis not present

## 2024-07-14 DIAGNOSIS — Z113 Encounter for screening for infections with a predominantly sexual mode of transmission: Secondary | ICD-10-CM | POA: Diagnosis not present

## 2024-07-14 DIAGNOSIS — R300 Dysuria: Secondary | ICD-10-CM | POA: Diagnosis not present

## 2024-07-14 DIAGNOSIS — Z118 Encounter for screening for other infectious and parasitic diseases: Secondary | ICD-10-CM | POA: Diagnosis not present

## 2024-09-06 ENCOUNTER — Telehealth: Payer: Self-pay

## 2024-09-06 NOTE — Telephone Encounter (Signed)
 Phone room: Please call and tell pt that the pt assistance forms needs to be renewed for zeposia .  Please encourage her to go online to [employer] and print the form for zeposia  and complete everything except for the provider portion. If she prefers we can mail it to her

## 2024-09-12 NOTE — Telephone Encounter (Signed)
 Phone rep called pt, she is asking that the forms be mailed to her please.

## 2024-10-05 NOTE — Telephone Encounter (Signed)
" ° ° ° ° °  Faxed to [facility]

## 2024-10-06 ENCOUNTER — Encounter: Payer: Self-pay | Admitting: Neurology

## 2024-10-06 NOTE — Telephone Encounter (Signed)
 Pt called requesting to speak to a nurse to discuss the paperwork that was sent out. She states she received a call informing her that they were not dated and she is wanting to know how she can get this fixed and resent again. Please advise.

## 2024-10-09 NOTE — Telephone Encounter (Signed)
 Pt missed dating their portion of the form. Will have to reprint and date for them in the office

## 2024-10-10 NOTE — Telephone Encounter (Signed)
" ° ° ° ° °  Refaxed to [facility]

## 2024-11-22 ENCOUNTER — Ambulatory Visit: Admitting: Dermatology

## 2024-11-22 ENCOUNTER — Ambulatory Visit: Admitting: Neurology
